# Patient Record
Sex: Female | Born: 1985 | Race: Black or African American | Hispanic: No | Marital: Married | Smoking: Never smoker
Health system: Southern US, Community
[De-identification: ages and names within clinical notes are randomized; demographics above are authoritative.]

## PROBLEM LIST (undated history)

## (undated) ENCOUNTER — Inpatient Hospital Stay (HOSPITAL_COMMUNITY): Payer: Self-pay

## (undated) DIAGNOSIS — N83209 Unspecified ovarian cyst, unspecified side: Secondary | ICD-10-CM

## (undated) DIAGNOSIS — F419 Anxiety disorder, unspecified: Secondary | ICD-10-CM

## (undated) DIAGNOSIS — F32A Depression, unspecified: Secondary | ICD-10-CM

## (undated) DIAGNOSIS — D649 Anemia, unspecified: Secondary | ICD-10-CM

## (undated) HISTORY — PX: OVARIAN CYST REMOVAL: SHX89

## (undated) HISTORY — PX: OOPHORECTOMY: SHX86

## (undated) HISTORY — DX: Anemia, unspecified: D64.9

## (undated) HISTORY — PX: LAPAROSCOPIC OVARIAN CYSTECTOMY: SUR786

## (undated) HISTORY — DX: Depression, unspecified: F32.A

## (undated) HISTORY — PX: BREAST SURGERY: SHX581

---

## 2010-12-15 HISTORY — PX: UNILATERAL SALPINGECTOMY: SHX6160

## 2012-01-26 LAB — US OB TRANSVAGINAL

## 2013-01-12 ENCOUNTER — Encounter (HOSPITAL_COMMUNITY): Payer: Self-pay | Admitting: *Deleted

## 2013-01-12 ENCOUNTER — Inpatient Hospital Stay (HOSPITAL_COMMUNITY)
Admission: AD | Admit: 2013-01-12 | Discharge: 2013-01-12 | Disposition: A | Discharge status: Home / Self Care | Payer: Self-pay | Source: Ambulatory Visit | Attending: Obstetrics & Gynecology | Admitting: Obstetrics & Gynecology

## 2013-01-12 DIAGNOSIS — B3731 Acute candidiasis of vulva and vagina: Secondary | ICD-10-CM | POA: Insufficient documentation

## 2013-01-12 DIAGNOSIS — N949 Unspecified condition associated with female genital organs and menstrual cycle: Secondary | ICD-10-CM | POA: Insufficient documentation

## 2013-01-12 DIAGNOSIS — B373 Candidiasis of vulva and vagina: Secondary | ICD-10-CM | POA: Insufficient documentation

## 2013-01-12 LAB — WET PREP, GENITAL
Clue Cells Wet Prep HPF POC: NONE SEEN
Trich, Wet Prep: NONE SEEN

## 2013-01-12 LAB — URINALYSIS, ROUTINE W REFLEX MICROSCOPIC
Glucose, UA: NEGATIVE mg/dL
Hgb urine dipstick: NEGATIVE
Protein, ur: NEGATIVE mg/dL

## 2013-01-12 LAB — URINE MICROSCOPIC-ADD ON

## 2013-01-12 MED ORDER — NYSTATIN-TRIAMCINOLONE 100000-0.1 UNIT/GM-% EX OINT: TOPICAL_OINTMENT | Freq: Two times a day (BID) | CUTANEOUS | Status: DC

## 2013-01-12 MED ORDER — TERCONAZOLE 0.4 % VA CREA: 1.0000 | TOPICAL_CREAM | Freq: Every day | VAGINAL | Status: DC

## 2013-01-12 NOTE — MAU Note (Signed)
Vaginal pain and swelling started yesterday afternoon, keeps getting worse.

## 2013-01-12 NOTE — Progress Notes (Signed)
Upon visual exam, labia and vagina are not swollen, no lesions noted.  Small amount white, clumpy discharge noted.

## 2013-01-12 NOTE — MAU Provider Note (Signed)
History     CSN: [id number]  Arrival date and time: 01/12/13 4782   None     Chief Complaint  Patient presents with  . Vaginal Pain   HPI 27 y.o. [id number] with vaginal pain and swelling since yesterday afternoon, getting worse, + white discharge, some itching.     History reviewed. No pertinent past medical history.  Past Surgical History  Procedure Date  . Ovarian cyst removal     Had to remove L ovary  . Oophorectomy     left  . Cesarean section     History reviewed. No pertinent family history.  History  Substance Use Topics  . Smoking status: Never Smoker   . Smokeless tobacco: Never Used  . Alcohol Use: No    Allergies: No Known Allergies  Prescriptions prior to admission  Medication Sig Dispense Refill  . ibuprofen (ADVIL,MOTRIN) 200 MG tablet Take 200 mg by mouth every 6 (six) hours as needed.        Review of Systems  Constitutional: Negative.   Respiratory: Negative.   Cardiovascular: Negative.   Gastrointestinal: Negative for nausea, vomiting, abdominal pain, diarrhea and constipation.  Genitourinary: Negative for dysuria, urgency, frequency, hematuria and flank pain.       Negative for vaginal bleeding, + vaginal discharge  Musculoskeletal: Negative.   Neurological: Negative.   Psychiatric/Behavioral: Negative.    Physical Exam   Blood pressure 123/59, pulse 74, temperature 98.6 F (37 C), temperature source Oral, resp. rate 18, height 5\' 7"  (1.702 m), weight 193 lb 3.2 oz (87.635 kg).  Physical Exam  Constitutional: She is oriented to person, place, and time. She appears well-developed and well-nourished. No distress.  HENT:  Head: Normocephalic and atraumatic.  Cardiovascular: Normal rate.   Respiratory: Effort normal. No respiratory distress.  Genitourinary: There is no rash or lesion on the right labia. There is no rash or lesion on the left labia. Cervix exhibits no motion tenderness, no discharge and no friability. There is  tenderness around the vagina. No erythema or bleeding around the vagina. Vaginal discharge (white, curdlike) found.  Neurological: She is alert and oriented to person, place, and time.  Skin: Skin is warm and dry.  Psychiatric: She has a normal mood and affect.    MAU Course  Procedures Results for orders placed during the hospital encounter of 01/12/13 (from the past 24 hour(s))  POCT PREGNANCY, URINE     Status: Normal   Collection Time   01/12/13  8:09 AM      Component Value Range   Preg Test, Ur NEGATIVE  NEGATIVE     Assessment and Plan   1. Yeast vaginitis       Medication List     As of 01/12/2013  8:48 AM    START taking these medications         nystatin-triamcinolone ointment   Commonly known as: MYCOLOG   Apply topically 2 (two) times daily.      terconazole 0.4 % vaginal cream   Commonly known as: TERAZOL 7   Place 1 applicator vaginally at bedtime.      CONTINUE taking these medications         ibuprofen 200 MG tablet   Commonly known as: ADVIL,MOTRIN          Where to get your medications    These are the prescriptions that you need to pick up.   You may get these medications from any pharmacy.  nystatin-triamcinolone ointment   terconazole 0.4 % vaginal cream            Follow-up Information    Follow up with [facility] [place]. (As needed)    Contact information:   [address] [place] [address] [phone]           Horatio Bertz 01/12/2013, 8:16 AM

## 2013-01-13 LAB — GC/CHLAMYDIA PROBE AMP: CT Probe RNA: NEGATIVE

## 2014-10-16 ENCOUNTER — Encounter (HOSPITAL_COMMUNITY): Payer: Self-pay | Admitting: *Deleted

## 2014-10-18 ENCOUNTER — Encounter: Payer: Self-pay | Admitting: Obstetrics & Gynecology

## 2014-10-18 ENCOUNTER — Ambulatory Visit (INDEPENDENT_AMBULATORY_CARE_PROVIDER_SITE_OTHER): Payer: 59 | Admitting: Obstetrics & Gynecology

## 2014-10-18 VITALS — BP 111/71 | HR 71 | Temp 98.8°F | Ht 66.0 in | Wt 195.0 lb

## 2014-10-18 DIAGNOSIS — Z01419 Encounter for gynecological examination (general) (routine) without abnormal findings: Secondary | ICD-10-CM

## 2014-10-18 DIAGNOSIS — Z3202 Encounter for pregnancy test, result negative: Secondary | ICD-10-CM

## 2014-10-18 LAB — CBC
HCT: 36.5 % (ref 36.0–46.0)
Hemoglobin: 11.8 g/dL — ABNORMAL LOW (ref 12.0–15.0)
MCH: 26.6 pg (ref 26.0–34.0)
MCHC: 32.3 g/dL (ref 30.0–36.0)
MCV: 82.4 fL (ref 78.0–100.0)
PLATELETS: 307 10*3/uL (ref 150–400)
RBC: 4.43 MIL/uL (ref 3.87–5.11)
RDW: 15.5 % (ref 11.5–15.5)
WBC: 8.9 10*3/uL (ref 4.0–10.5)

## 2014-10-18 LAB — POCT URINE PREGNANCY: PREG TEST UR: NEGATIVE

## 2014-10-19 LAB — HIV ANTIBODY (ROUTINE TESTING W REFLEX): HIV: NONREACTIVE

## 2014-10-19 LAB — RPR

## 2014-10-20 ENCOUNTER — Telehealth: Payer: Self-pay | Admitting: *Deleted

## 2014-10-20 LAB — PAP IG, CT-NG NAA, HPV HIGH-RISK
CHLAMYDIA PROBE AMP: NEGATIVE
GC PROBE AMP: NEGATIVE
HPV DNA HIGH RISK: NOT DETECTED

## 2014-10-20 NOTE — Telephone Encounter (Signed)
Pt placed call to office stating that Rx was to be sent to her pharmacy at her last visit, 10-18-14. Return call to pt, no answer.  Left message to contact office on Monday.

## 2014-10-20 NOTE — Patient Instructions (Signed)

## 2014-10-20 NOTE — Progress Notes (Signed)
Subjective:     Tammy Hanson is a 28 y.o. female here for a routine exam.     Personal health questionnaire:  Is patient Ashkenazi Jewish, have a family history of breast and/or ovarian cancer: no Is there a family history of uterine cancer diagnosed at age < 3650, gastrointestinal cancer, urinary tract cancer, family member who is a Personnel officerLynch syndrome-associated carrier: no Is the patient overweight and hypertensive, family history of diabetes, personal history of gestational diabetes or PCOS: no Is patient over 755, have PCOS,  family history of premature CHD under age 26, diabetes, smoke, have hypertension or peripheral artery disease:  no At any time, has a partner hit, kicked or otherwise hurt or frightened you?: no Over the past 2 weeks, have you felt down, depressed or hopeless?: no Over the past 2 weeks, have you felt little interest or pleasure in doing things?:no   Gynecologic History Patient's last menstrual period was 10/16/2014 (exact date).    Obstetric History OB History  Gravida Para Term Preterm AB SAB TAB Ectopic Multiple Living  4 1 1  3 3    2     # Outcome Date GA Lbr Len/2nd Weight Sex Delivery Anes PTL Lv  4 SAB 06/14/12     SAB     3 SAB 07/16/11     SAB     2 SAB 01/28/11     SAB     1 Term 12/16/09 [id number]  3.033 kg (6 lb 11 oz) M CS-LTranv EPI N Y     Complications: Umbilical cord around fetal neck with cord compression      Past Medical History  Diagnosis Date  . 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 Ovarian cyst removal      Had to remove L ovary  . Oophorectomy      left  . Cesarean section    . Unilateral salpingectomy Right 2012    No current outpatient prescriptions on file. No Known Allergies  History  Substance Use Topics  . Smoking status: Never Smoker   . Smokeless tobacco: Never Used  . Alcohol Use: No    Family History  Problem Relation Age of Onset  . Heart disease Mother        Review of Systems  Constitutional: negative for fatigue and weight loss Respiratory: negative for cough and wheezing Cardiovascular: negative for chest pain, fatigue and palpitations Gastrointestinal: negative for abdominal pain and change in bowel habits Musculoskeletal:negative for myalgias Neurological: negative for gait problems and tremors Behavioral/Psych: negative for abusive relationship, depression Endocrine: negative for temperature intolerance   Genitourinary:negative for abnormal menstrual periods, genital lesions, hot flashes, sexual problems and vaginal discharge Integument/breast: negative for breast lump, breast tenderness, nipple discharge and skin lesion(s)    Objective:       BP 111/71 mmHg  Pulse 71  Temp(Src) 98.8 F (37.1 C)  Ht 5\' 6"  (1.676 m)  Wt 88.451 kg (195 lb)  BMI 31.49 kg/m2  LMP 10/16/2014 (Exact Date) General:   alert  Skin:   no rash or abnormalities  Lungs:   clear to auscultation bilaterally  Heart:   regular rate and rhythm, S1, S2 normal, no murmur, click, rub or gallop  Breasts:   normal without suspicious masses, skin or nipple changes or axillary nodes  Abdomen:  normal findings: no organomegaly, soft, non-tender and no hernia  Pelvis:  External genitalia: normal general appearance Urinary system: urethral  meatus normal and bladder without fullness, nontender Vaginal: normal without tenderness, induration or masses Cervix: normal appearance Adnexa: normal bimanual exam Uterus: anteverted and non-tender, normal size   Lab Review Urine pregnancy test Labs reviewed no Radiologic studies reviewed no     Assessment:    Healthy female exam.   ?h/o an early stage ovarian cancer   Plan:    Education reviewed: calcium supplements, low fat, low cholesterol diet, safe sex/STD prevention and weight bearing exercise.    Orders Placed This Encounter  Procedures  . HIV antibody  . RPR  . CBC  . POCT urine pregnancy   Need  to obtain previous records Follow up as needed.

## 2014-10-23 NOTE — Telephone Encounter (Signed)
Patient called regarding her RX- 10:34 11/9 4:04 Returned call- LM on VM to CB- no Rx mentioned at visit.

## 2014-10-23 NOTE — Telephone Encounter (Signed)
4:36  Received message from pt stating that she missed a call from office.  Pt ask that a message be left on voicemail regarding Rx. 5:13 return call to pt.  No answer, left message making pt aware that there is no note of medication to be sent to pharmacy.  Pt asked to contact office to let us know what medication was discussed and to be sent.

## 2014-10-24 ENCOUNTER — Other Ambulatory Visit: Payer: Self-pay | Admitting: *Deleted

## 2014-10-24 DIAGNOSIS — Z30013 Encounter for initial prescription of injectable contraceptive: Secondary | ICD-10-CM

## 2014-10-24 MED ORDER — MEDROXYPROGESTERONE ACETATE 150 MG/ML IM SUSP
150.0000 mg | INTRAMUSCULAR | Status: DC
Start: 2014-10-24 — End: 2015-03-07

## 2014-10-24 NOTE — Telephone Encounter (Signed)
Patient called to let us know that her Depo provera was not at the pharmacy.  2:01 Call to patient - patient states she has an appointment tomorrow for a Depo injection and that the doctor has not sent her Rx. Told patient we were sending it now and she could pick it up.

## 2014-11-01 ENCOUNTER — Other Ambulatory Visit (INDEPENDENT_AMBULATORY_CARE_PROVIDER_SITE_OTHER): Payer: 59 | Admitting: *Deleted

## 2014-11-01 VITALS — BP 107/64 | HR 76

## 2014-11-01 DIAGNOSIS — Z01818 Encounter for other preprocedural examination: Secondary | ICD-10-CM

## 2014-11-01 DIAGNOSIS — Z3042 Encounter for surveillance of injectable contraceptive: Secondary | ICD-10-CM

## 2014-11-01 DIAGNOSIS — Z3202 Encounter for pregnancy test, result negative: Secondary | ICD-10-CM

## 2014-11-01 DIAGNOSIS — Z30013 Encounter for initial prescription of injectable contraceptive: Secondary | ICD-10-CM

## 2014-11-01 LAB — POCT URINE PREGNANCY: Preg Test, Ur: NEGATIVE

## 2014-11-01 MED ORDER — MEDROXYPROGESTERONE ACETATE 150 MG/ML IM SUSP
150.0000 mg | Freq: Once | INTRAMUSCULAR | Status: AC
  Administered 2014-11-01: 150 mg via INTRAMUSCULAR

## 2014-11-01 NOTE — Progress Notes (Signed)
Pt is in office today for start of depo.  Pt was seen in office on 10-18-14 and had negative pregnancy test.  UPT in office today is negative.  Pt made aware of Depo policy and advised if she has any concerns to contact the office.  Depo was given and pt tolerated well.  Pt advised to RTO on 01-20-15 for next injection.   BP 107/64 mmHg  Pulse 76  LMP 10/16/2014 (Exact Date)   Administrations This Visit    medroxyPROGESTERone (DEPO-PROVERA) injection 150 mg    Administered Action Dose Route Administered By         11/01/2014 Given 150 mg Intramuscular Lanney GinsSuzanne D Judyann Casasola, CMA

## 2014-12-05 ENCOUNTER — Telehealth: Payer: Self-pay | Admitting: *Deleted

## 2014-12-05 NOTE — Telephone Encounter (Signed)
Patient is calling about her birth control. 4:59 LM on VM to CB

## 2014-12-06 NOTE — Telephone Encounter (Signed)
9:08 Patient states she has been bleeding for 10 days. Patient is using the Depo- her first injection was at last visit. Patient reports regular flow. Patient would like to stop bleeding.

## 2014-12-09 NOTE — Telephone Encounter (Signed)
No treatment is necessary.  Can offer 1 month of COCP

## 2014-12-11 ENCOUNTER — Encounter: Payer: Self-pay | Admitting: *Deleted

## 2014-12-12 ENCOUNTER — Encounter: Payer: Self-pay | Admitting: Obstetrics & Gynecology

## 2014-12-19 NOTE — Telephone Encounter (Signed)
LM on VM for patient to CB. 

## 2015-01-16 NOTE — Telephone Encounter (Signed)
No response to call- refile

## 2015-01-23 ENCOUNTER — Ambulatory Visit (INDEPENDENT_AMBULATORY_CARE_PROVIDER_SITE_OTHER): Payer: 59 | Admitting: *Deleted

## 2015-01-23 VITALS — BP 117/78 | HR 56 | Temp 97.8°F | Wt 193.0 lb

## 2015-01-23 DIAGNOSIS — Z3042 Encounter for surveillance of injectable contraceptive: Secondary | ICD-10-CM

## 2015-01-23 MED ORDER — MEDROXYPROGESTERONE ACETATE 150 MG/ML IM SUSP
150.0000 mg | Freq: Once | INTRAMUSCULAR | Status: AC
  Administered 2015-01-23: 150 mg via INTRAMUSCULAR

## 2015-01-23 NOTE — Progress Notes (Signed)
Pt is in office today for depo injection.  Pt is on time for her injection.  Pt tolerated well. Pt advised to RTO on Apr 17, 2015 for next injection.    BP 117/78 mmHg  Pulse 56  Temp(Src) 97.8 F (36.6 C)  Wt 193 lb (87.544 kg)  LMP 01/23/2015

## 2015-01-30 ENCOUNTER — Telehealth: Payer: Self-pay | Admitting: *Deleted

## 2015-01-30 NOTE — Telephone Encounter (Signed)
11:12 Patient  returned call - she is at work and her lunch is 1:30-2:00. Please call then.

## 2015-01-30 NOTE — Telephone Encounter (Signed)
Bleeding on the Depo shot for 2 weeks. 10:45 LM on VM to CB.

## 2015-03-02 ENCOUNTER — Telehealth: Payer: Self-pay | Admitting: *Deleted

## 2015-03-02 DIAGNOSIS — N939 Abnormal uterine and vaginal bleeding, unspecified: Secondary | ICD-10-CM

## 2015-03-02 MED ORDER — LEVONORGESTREL-ETHINYL ESTRAD 0.15-30 MG-MCG PO TABS: ORAL_TABLET | ORAL | Status: DC

## 2015-03-02 NOTE — Telephone Encounter (Signed)
See next note

## 2015-03-02 NOTE — Telephone Encounter (Signed)
Patient called with problem with birth control. 9:40 Call to patient- she is using Depo and she is having bleeding for 2 weeks- off 1 day- and then bleeding 2 weeks again. She wants to try to continue the Depo,but needs to stop her BTB. Will try birth control pills to stop per Dr Clearance CootsHarper. Patient instructed and Rx called to pharmacy.

## 2015-03-07 ENCOUNTER — Encounter (HOSPITAL_COMMUNITY): Payer: Self-pay | Admitting: *Deleted

## 2015-03-07 ENCOUNTER — Inpatient Hospital Stay (HOSPITAL_COMMUNITY)
Admission: AD | Admit: 2015-03-07 | Discharge: 2015-03-07 | Disposition: A | Discharge status: Home / Self Care | Payer: 59 | Source: Ambulatory Visit | Attending: Obstetrics | Admitting: Obstetrics

## 2015-03-07 ENCOUNTER — Inpatient Hospital Stay (HOSPITAL_COMMUNITY): Payer: 59

## 2015-03-07 DIAGNOSIS — F410 Panic disorder [episodic paroxysmal anxiety] without agoraphobia: Secondary | ICD-10-CM | POA: Diagnosis not present

## 2015-03-07 DIAGNOSIS — R0602 Shortness of breath: Secondary | ICD-10-CM | POA: Insufficient documentation

## 2015-03-07 DIAGNOSIS — R0789 Other chest pain: Secondary | ICD-10-CM | POA: Diagnosis not present

## 2015-03-07 DIAGNOSIS — F419 Anxiety disorder, unspecified: Secondary | ICD-10-CM | POA: Insufficient documentation

## 2015-03-07 DIAGNOSIS — R079 Chest pain, unspecified: Secondary | ICD-10-CM | POA: Diagnosis present

## 2015-03-07 LAB — URINALYSIS, ROUTINE W REFLEX MICROSCOPIC
Bilirubin Urine: NEGATIVE
Glucose, UA: NEGATIVE mg/dL
Ketones, ur: NEGATIVE mg/dL
Leukocytes, UA: NEGATIVE
Nitrite: NEGATIVE
Protein, ur: NEGATIVE mg/dL
Specific Gravity, Urine: 1.015 (ref 1.005–1.030)
Urobilinogen, UA: 0.2 mg/dL (ref 0.0–1.0)
pH: 6 (ref 5.0–8.0)

## 2015-03-07 LAB — CBC
HEMATOCRIT: 35.4 % — AB (ref 36.0–46.0)
Hemoglobin: 11.4 g/dL — ABNORMAL LOW (ref 12.0–15.0)
MCH: 27.2 pg (ref 26.0–34.0)
MCHC: 32.2 g/dL (ref 30.0–36.0)
MCV: 84.5 fL (ref 78.0–100.0)
Platelets: 233 10*3/uL (ref 150–400)
RBC: 4.19 MIL/uL (ref 3.87–5.11)
RDW: 15 % (ref 11.5–15.5)
WBC: 10.2 10*3/uL (ref 4.0–10.5)

## 2015-03-07 LAB — URINE MICROSCOPIC-ADD ON

## 2015-03-07 LAB — D-DIMER, QUANTITATIVE (NOT AT ARMC): D-Dimer, Quant: 0.74 ug/mL-FEU — ABNORMAL HIGH (ref 0.00–0.48)

## 2015-03-07 LAB — POCT PREGNANCY, URINE: Preg Test, Ur: NEGATIVE

## 2015-03-07 MED ORDER — IOHEXOL 350 MG/ML SOLN
100.0000 mL | Freq: Once | INTRAVENOUS | Status: AC | PRN
  Administered 2015-03-07: 100 mL via INTRAVENOUS

## 2015-03-07 MED ORDER — ONDANSETRON 4 MG PO TBDP
4.0000 mg | ORAL_TABLET | Freq: Once | ORAL | Status: AC
  Administered 2015-03-07: 4 mg via ORAL
  Filled 2015-03-07: qty 1

## 2015-03-07 MED ORDER — ALPRAZOLAM 0.25 MG PO TABS
0.2500 mg | ORAL_TABLET | Freq: Once | ORAL | Status: AC
  Administered 2015-03-07: 0.25 mg via ORAL
  Filled 2015-03-07: qty 1

## 2015-03-07 MED ORDER — ALPRAZOLAM 0.25 MG PO TABS: 0.2500 mg | ORAL_TABLET | Freq: Every evening | ORAL | Status: DC | PRN

## 2015-03-07 MED ORDER — GI COCKTAIL ~~LOC~~
30.0000 mL | Freq: Once | ORAL | Status: AC
  Administered 2015-03-07: 30 mL via ORAL
  Filled 2015-03-07: qty 30

## 2015-03-07 NOTE — Discharge Instructions (Signed)

## 2015-03-07 NOTE — MAU Note (Signed)
Started on Sat. Extreme chest pain.  Felt shaky, short of breath, pain in heart, heart started racing. Was laying down when occurred. Took a while for it to calm down.  Happened again this morning. Threw up 3 times when occurred this morning. Now, "feels awful",chest hurts, hard to breath.  (o2 sat is 100, closed mouth breathing, unlabored, able to talk without pause or exertion).

## 2015-03-07 NOTE — MAU Provider Note (Signed)
History     CSN: [id number]  Arrival date and time: 03/07/15 7829   First Provider Initiated Contact with Patient 03/07/15 [phone]      Chief Complaint  Patient presents with  . Shortness of Breath  . Chest Pain   HPI Comments: Tammy Hanson is 29 y.o. Female [id number] who presents with chest pain. The chest pain started Saturday evening; she woke up with the pain. She started birth control pills on Saturday morning. She took [facility] pills on Saturday, Sunday and Monday and stopped. She does not smoke. Denies history of blood clot.  She ate a buffalo chicken wrap last night for dinner with a smoothie; she vomited this morning.  She does have a history anxiety; and has had an anxiety attack in the past. She does feel like this is the same feeling.   Chest Pain  This is a new problem. The current episode started in the past 7 days. The onset quality is gradual. The problem occurs constantly. The problem has been waxing and waning. The pain is present in the substernal region (Patient able to produce the pain by pushing on chest.). The pain is at a severity of 7/10. The pain is moderate. The quality of the pain is described as tightness and pressure. Associated symptoms include abdominal pain, dizziness, nausea, shortness of breath and vomiting. Pertinent negatives include no claudication, orthopnea or palpitations. The pain is aggravated by nothing. She has tried nothing for the symptoms. Risk factors include hormone replacement therapy and stress.    OB History    Gravida Para Term Preterm AB TAB SAB Ectopic Multiple Living   Past Medical History  Diagnosis Date  . Cancer     Past Surgical History  Procedure Laterality Date  . Ovarian cyst removal      Had to remove L ovary  . Oophorectomy      left  . Cesarean section    . Unilateral salpingectomy Right 2012    Family History  Problem Relation Age of Onset  . Heart disease Mother     History   Substance Use Topics  . Smoking status: Never Smoker   . Smokeless tobacco: Never Used  . Alcohol Use: 0.0 oz/week    0 Standard drinks or equivalent per week     Comment: occ    Allergies: No Known Allergies  Prescriptions prior to admission  Medication Sig Dispense Refill Last Dose  . levonorgestrel-ethinyl estradiol (NORDETTE) 0.15-30 MG-MCG tablet 2 tablets daily- active pills only for abnormal bleeding on Depo 1 Package 0   . medroxyPROGESTERone (DEPO-PROVERA) 150 MG/ML injection Inject 1 mL (150 mg total) into the muscle every 3 (three) months. 150 mL 3    Results for orders placed or performed during the hospital encounter of 03/07/15 (from the past 48 hour(s))  Urinalysis, Routine w reflex microscopic     Status: Abnormal   Collection Time: 03/07/15  9:35 AM  Result Value Ref Range   Color, Urine YELLOW YELLOW   APPearance CLEAR CLEAR   Specific Gravity, Urine 1.015 1.005 - 1.030   pH 6.0 5.0 - 8.0   Glucose, UA NEGATIVE NEGATIVE mg/dL   Hgb urine dipstick SMALL (A) NEGATIVE   Bilirubin Urine NEGATIVE NEGATIVE   Ketones, ur NEGATIVE NEGATIVE mg/dL   Protein, ur NEGATIVE NEGATIVE mg/dL   Urobilinogen, UA 0.2 0.0 - 1.0 mg/dL   Nitrite NEGATIVE NEGATIVE  Leukocytes, UA NEGATIVE NEGATIVE  Urine microscopic-add on     Status: None   Collection Time: 03/07/15  9:35 AM  Result Value Ref Range   Squamous Epithelial / LPF RARE RARE   RBC / HPF 0-2 <3 RBC/hpf  Pregnancy, urine POC     Status: None   Collection Time: 03/07/15  9:41 AM  Result Value Ref Range   Preg Test, Ur NEGATIVE NEGATIVE    Comment:        THE SENSITIVITY OF THIS METHODOLOGY IS >24 mIU/mL   CBC     Status: Abnormal   Collection Time: 03/07/15 11:30 AM  Result Value Ref Range   WBC 10.2 4.0 - 10.5 K/uL   RBC 4.19 3.87 - 5.11 MIL/uL   Hemoglobin 11.4 (L) 12.0 - 15.0 g/dL   HCT 40.935.4 (L) 81.136.0 - 91.446.0 %   MCV 84.5 78.0 - 100.0 fL   MCH 27.2 26.0 - 34.0 pg   MCHC 32.2 30.0 - 36.0 g/dL   RDW 78.215.0  95.611.5 - 21.315.5 %   Platelets 233 150 - 400 K/uL  D-dimer, quantitative     Status: Abnormal   Collection Time: 03/07/15 11:30 AM  Result Value Ref Range   D-Dimer, Quant 0.74 (H) 0.00 - 0.48 ug/mL-FEU    Comment:        AT THE INHOUSE ESTABLISHED CUTOFF VALUE OF 0.48 ug/mL FEU, THIS ASSAY HAS BEEN DOCUMENTED IN THE LITERATURE TO HAVE A SENSITIVITY AND NEGATIVE PREDICTIVE VALUE OF AT LEAST 98 TO 99%.  THE TEST RESULT SHOULD BE CORRELATED WITH AN ASSESSMENT OF THE CLINICAL PROBABILITY OF DVT / VTE.    Ct Angio Chest Pe W/cm &/or Wo Cm  03/07/2015   CLINICAL DATA:  Five day history of chest pressure and shortness of breath.  EXAM: CT ANGIOGRAPHY CHEST WITH CONTRAST  TECHNIQUE: Multidetector CT imaging of the chest was performed using the standard protocol during bolus administration of intravenous contrast. Multiplanar CT image reconstructions and MIPs were obtained to evaluate the vascular anatomy.  CONTRAST:  100mL OMNIPAQUE IOHEXOL 350 MG/ML SOLN  COMPARISON:  None.  FINDINGS: Mediastinum/Nodes: No filling defect is identified in the pulmonary arterial tree to suggest pulmonary embolus. No acute aortic abnormality observed. Small bilateral axillary lymph nodes are not pathologically enlarged by size criteria. No other thoracic adenopathy observed.  Lungs/Pleura: Unremarkable  Upper abdomen: Possible hepatic steatosis.  Musculoskeletal: Unremarkable  Review of the MIP images confirms the above findings.  IMPRESSION: 1. Possible hepatic steatosis. Otherwise, no significant abnormalities are observed. No filling defect is identified in the pulmonary arterial tree to suggest pulmonary embolus.   Electronically Signed   By: Gaylyn RongWalter  Liebkemann M.D.   On: 03/07/2015 13:30     Review of Systems  Eyes: Negative for blurred vision.  Respiratory: Positive for shortness of breath.   Cardiovascular: Positive for chest pain. Negative for palpitations, orthopnea, claudication and leg swelling.   Gastrointestinal: Positive for nausea, vomiting and abdominal pain.  Neurological: Positive for dizziness.   Physical Exam   Blood pressure 122/67, pulse 84, temperature 98.6 F (37 C), temperature source Oral, resp. rate 16, height 5' 5.5" (1.664 m), weight 87.091 kg (192 lb), last menstrual period 02/20/2015, SpO2 100 %.  Physical Exam  Constitutional: She is oriented to person, place, and time. Vital signs are normal. She appears well-developed and well-nourished.  Non-toxic appearance. She does not have a sickly appearance. She does not appear ill. No distress.  HENT:  Head: Normocephalic.  Eyes: Pupils  are equal, round, and reactive to light.  Neck: Neck supple.  Cardiovascular: Normal rate.   Murmur heard. Respiratory: Effort normal and breath sounds normal. No respiratory distress. She has no wheezes. She has no rales.  Musculoskeletal: Normal range of motion.  Neurological: She is alert and oriented to person, place, and time.  Skin: Skin is warm. She is not diaphoretic.  Psychiatric: Her behavior is normal.    MAU Course  Procedures  None  MDM  EKG Gi Cock tail Zofran 4 mg ODT   Normal sinus rhythm per EKG.   Gi cocktail allevatied the pressure in her chest. Currently rates pain 5/10; D-Dimer ordered, CBC  Xanax 0.25 mg PO in MAU.   Patient denies pain following xanax and gi cocktail> due to elevated D-Dimer and patients recent birthcontrol pill use I will do a CT   Discussed patient with Dr. Macon Large   Assessment and Plan   A:  1. Anxiety attack   2. Shortness of breath   3. Other chest pain     P:  Discharge home in stable condition Go to [facility] or [facility] if shortness of breath persists RX: Xanax Follow up with OB as scheduled  Duane Lope, NP 03/07/2015 10:12 AM

## 2015-03-07 NOTE — MAU Note (Signed)
Pts blood pressure was taken throughout stay and evaluation every 15 minutes.  The blood pressures were assessed each time and WNL. The Equipment did not save or record data for the pts stay.

## 2015-03-07 NOTE — MAU Note (Signed)
Pt feeling better. No chest tightness, no pain.  Symptoms relieved. Will continue to monitor.

## 2015-03-27 ENCOUNTER — Telehealth: Payer: Self-pay | Admitting: *Deleted

## 2015-03-27 NOTE — Telephone Encounter (Signed)
Patient wants to take OCP pills to stop her bleeding on Depo- patient has decided that she does not want to continue her Depo. 3:05 LM on VM to CB- need to know what form of birth control patient wants to use in the place of her Depo provera.

## 2015-03-28 ENCOUNTER — Other Ambulatory Visit: Payer: Self-pay | Admitting: *Deleted

## 2015-03-28 DIAGNOSIS — Z3041 Encounter for surveillance of contraceptive pills: Secondary | ICD-10-CM

## 2015-03-28 MED ORDER — LEVONORGESTREL-ETHINYL ESTRAD 0.15-30 MG-MCG PO TABS
1.0000 | ORAL_TABLET | Freq: Every day | ORAL | Status: DC
Start: 2015-03-28 — End: 2015-08-15

## 2015-04-17 ENCOUNTER — Ambulatory Visit: Payer: 59

## 2015-07-04 ENCOUNTER — Telehealth: Payer: Self-pay | Admitting: *Deleted

## 2015-07-04 NOTE — Telephone Encounter (Signed)
Patient states she is not having any luck with her current birth control. Patient states she is currently on Nordette and was on the Depo before that. Patient scheduled for a birth control consult on 07-06-15.

## 2015-07-06 ENCOUNTER — Ambulatory Visit (INDEPENDENT_AMBULATORY_CARE_PROVIDER_SITE_OTHER): Payer: 59 | Admitting: Certified Nurse Midwife

## 2015-07-06 ENCOUNTER — Encounter: Payer: Self-pay | Admitting: Certified Nurse Midwife

## 2015-07-06 ENCOUNTER — Other Ambulatory Visit: Payer: Self-pay | Admitting: Certified Nurse Midwife

## 2015-07-06 VITALS — BP 104/74 | HR 94 | Temp 98.8°F | Wt 190.0 lb

## 2015-07-06 DIAGNOSIS — N939 Abnormal uterine and vaginal bleeding, unspecified: Secondary | ICD-10-CM | POA: Diagnosis not present

## 2015-07-06 DIAGNOSIS — Z113 Encounter for screening for infections with a predominantly sexual mode of transmission: Secondary | ICD-10-CM

## 2015-07-06 DIAGNOSIS — Z3009 Encounter for other general counseling and advice on contraception: Secondary | ICD-10-CM

## 2015-07-06 LAB — COMPREHENSIVE METABOLIC PANEL
AST: 15 U/L (ref 0–37)
Albumin: 4.2 g/dL (ref 3.5–5.2)
Alkaline Phosphatase: 62 U/L (ref 39–117)
BILIRUBIN TOTAL: 0.8 mg/dL (ref 0.2–1.2)
BUN: 6 mg/dL (ref 6–23)
CALCIUM: 9.5 mg/dL (ref 8.4–10.5)
CO2: 24 meq/L (ref 19–32)
CREATININE: 1 mg/dL (ref 0.50–1.10)
Chloride: 102 mEq/L (ref 96–112)
Glucose, Bld: 93 mg/dL (ref 70–99)
Potassium: 4.2 mEq/L (ref 3.5–5.3)
SODIUM: 139 meq/L (ref 135–145)
Total Protein: 7.7 g/dL (ref 6.0–8.3)

## 2015-07-06 LAB — CBC WITH DIFFERENTIAL/PLATELET
BASOS PCT: 0 % (ref 0–1)
Basophils Absolute: 0 10*3/uL (ref 0.0–0.1)
Eosinophils Absolute: 0 10*3/uL (ref 0.0–0.7)
Eosinophils Relative: 0 % (ref 0–5)
HEMATOCRIT: 36.5 % (ref 36.0–46.0)
HEMOGLOBIN: 11.6 g/dL — AB (ref 12.0–15.0)
LYMPHS ABS: 2.2 10*3/uL (ref 0.7–4.0)
Lymphocytes Relative: 22 % (ref 12–46)
MCH: 25.7 pg — ABNORMAL LOW (ref 26.0–34.0)
MCHC: 31.8 g/dL (ref 30.0–36.0)
MCV: 80.9 fL (ref 78.0–100.0)
MPV: 10.6 fL (ref 8.6–12.4)
Monocytes Absolute: 0.5 10*3/uL (ref 0.1–1.0)
Monocytes Relative: 5 % (ref 3–12)
NEUTROS ABS: 7.2 10*3/uL (ref 1.7–7.7)
NEUTROS PCT: 73 % (ref 43–77)
PLATELETS: 446 10*3/uL — AB (ref 150–400)
RBC: 4.51 MIL/uL (ref 3.87–5.11)
RDW: 15.6 % — ABNORMAL HIGH (ref 11.5–15.5)
WBC: 9.9 10*3/uL (ref 4.0–10.5)

## 2015-07-06 LAB — RPR

## 2015-07-06 LAB — HDL CHOLESTEROL: HDL: 48 mg/dL (ref >=46)

## 2015-07-06 LAB — CHOLESTEROL, TOTAL: CHOLESTEROL: 153 mg/dL (ref 0–200)

## 2015-07-06 LAB — TRIGLYCERIDES: TRIGLYCERIDES: 88 mg/dL (ref <150)

## 2015-07-06 MED ORDER — LEVONORGEST-ETH EST & ETH EST 42-21-21-7 DAYS PO TABS: 1.0000 | ORAL_TABLET | Freq: Every day | ORAL | Status: DC

## 2015-07-06 MED ORDER — IBUPROFEN 800 MG PO TABS: 800.0000 mg | ORAL_TABLET | Freq: Three times a day (TID) | ORAL | Status: DC | PRN

## 2015-07-06 NOTE — Progress Notes (Signed)
Patient ID: Tammy Hanson, female   DOB: 1985/12/27, 29 y.o.   MRN: [id number]   Chief Complaint  Patient presents with  . Advice Only    birth control    HPI Tammy Hanson is a 29 y.o. female.   Currently on Nordette, was previously on Depo-Injections.  Desires to stop having long periods.  States periods are 7+ days with heavy bleeding even on Depo.    HPI  Past Medical History  Diagnosis Date  . Cancer   ? Dx, does have hx of left ovarian cysts with removal; does have right ovary with ? Dx of complex ovarian cyst  Past Surgical History  Procedure Laterality Date  . Ovarian cyst removal      Had to remove L ovary  . Oophorectomy      left  . Cesarean section    . Unilateral salpingectomy Right 2012    Family History  Problem Relation Age of Onset  . Heart disease Mother     Social History History  Substance Use Topics  . Smoking status: Never Smoker   . Smokeless tobacco: Never Used  . Alcohol Use: 0.0 oz/week    0 Standard drinks or equivalent per week     Comment: occ    No Known Allergies  Current Outpatient Prescriptions  Medication Sig Dispense Refill  . levonorgestrel-ethinyl estradiol (NORDETTE, 28,) 0.15-30 MG-MCG tablet Take 1 tablet by mouth daily. 1 Package 11  . ALPRAZolam (XANAX) 0.25 MG tablet Take 1 tablet (0.25 mg total) by mouth at bedtime as needed for anxiety. (Patient not taking: Reported on 07/06/2015) 30 tablet 0  . ibuprofen (ADVIL,MOTRIN) 800 MG tablet Take 1 tablet (800 mg total) by mouth every 8 (eight) hours as needed. 60 tablet 1  . Levonorgest-Eth Est & Eth Est (QUARTETTE) 42-21-21-7 DAYS TABS Take 1 tablet by mouth daily. 91 tablet 1   No current facility-administered medications for this visit.    Review of Systems Review of Systems Constitutional: negative for fatigue and weight loss Respiratory: negative for cough and wheezing Cardiovascular: negative for chest pain, fatigue and palpitations Gastrointestinal:  negative for abdominal pain and change in bowel habits Genitourinary: +AUB Integument/breast: negative for nipple discharge Musculoskeletal:negative for myalgias Neurological: negative for gait problems and tremors Behavioral/Psych: negative for abusive relationship, depression Endocrine: negative for temperature intolerance     Blood pressure 104/74, pulse 94, temperature 98.8 F (37.1 C), weight 190 lb (86.183 kg), last menstrual period 06/27/2015.  Physical Exam Physical Exam General:   alert  Skin:   no rash or abnormalities  Lungs:   clear to auscultation bilaterally  Heart:   regular rate and rhythm, S1, S2 normal, no murmur, click, rub or gallop  Breasts:   deferred  Abdomen:  normal findings: no organomegaly, soft, non-tender and no hernia  Pelvis:  External genitalia: normal general appearance Urinary system: urethral meatus normal and bladder without fullness, nontender Vaginal: normal without tenderness, induration or masses Cervix: normal appearance Adnexa: no CMT Uterus: anteverted and non-tender, normal size    90% of 15 min visit spent on counseling and coordination of care.   Data Reviewed Previous medical hx, labs, meds  Assessment     AUB Dysmenorrhea Menorrahgia     Plan    Orders Placed This Encounter  Procedures  . US Transvaginal Non-OB    Standing Status: Future     Number of Occurrences:      Standing Expiration Date: 09/05/2016    Order Specific  Question:  Reason for Exam (SYMPTOM  OR DIAGNOSIS REQUIRED)    Answer:  AUB    Order Specific Question:  Preferred imaging location?    Answer:  [facility]  . US Pelvis Complete    Standing Status: Future     Number of Occurrences:      Standing Expiration Date: 09/05/2016    Order Specific Question:  Reason for Exam (SYMPTOM  OR DIAGNOSIS REQUIRED)    Answer:  AUB    Order Specific Question:  Preferred imaging location?    Answer:  [facility]  . HIV antibody (with reflex)  .  Hepatitis B surface antigen  . RPR  . Hepatitis C antibody  . TSH  . Prolactin  . Testosterone, Free, Total, SHBG  . 17-Hydroxyprogesterone  . Progesterone  . CBC with Differential/Platelet  . Comprehensive metabolic panel  . Cholesterol, total  . Triglycerides  . HDL cholesterol   Meds ordered this encounter  Medications  . Levonorgest-Eth Est & Eth Est (QUARTETTE) 42-21-21-7 DAYS TABS    Sig: Take 1 tablet by mouth daily.    Dispense:  91 tablet    Refill:  1  . ibuprofen (ADVIL,MOTRIN) 800 MG tablet    Sig: Take 1 tablet (800 mg total) by mouth every 8 (eight) hours as needed.    Dispense:  60 tablet    Refill:  1    Follow up in 3 months

## 2015-07-07 LAB — TSH: TSH: 0.174 u[IU]/mL — AB (ref 0.350–4.500)

## 2015-07-07 LAB — PROGESTERONE: Progesterone: <0.2 ng/mL

## 2015-07-07 LAB — PROLACTIN: PROLACTIN: 7 ng/mL

## 2015-07-07 LAB — HIV ANTIBODY (ROUTINE TESTING W REFLEX): HIV 1&2 Ab, 4th Generation: NONREACTIVE

## 2015-07-07 LAB — HEPATITIS B SURFACE ANTIGEN: Hepatitis B Surface Ag: NEGATIVE

## 2015-07-07 LAB — HEPATITIS C ANTIBODY: HCV Ab: NEGATIVE

## 2015-07-09 LAB — TESTOSTERONE, FREE, TOTAL, SHBG
SEX HORMONE BINDING: 60 nmol/L (ref 17–124)
TESTOSTERONE FREE: 1.6 pg/mL (ref 0.6–6.8)
TESTOSTERONE-% FREE: 1.2 % (ref 0.4–2.4)
Testosterone: 13 ng/dL (ref 10–70)

## 2015-07-10 ENCOUNTER — Other Ambulatory Visit: Payer: Self-pay | Admitting: Certified Nurse Midwife

## 2015-07-10 DIAGNOSIS — R7989 Other specified abnormal findings of blood chemistry: Secondary | ICD-10-CM

## 2015-07-10 LAB — THYROID PROFILE - CHCC
FREE THYROXINE INDEX: 2.7 (ref 1.4–3.8)
T3 Uptake: 25 % (ref 22–35)
T4, Total: 10.9 ug/dL (ref 4.5–12.0)

## 2015-07-10 LAB — 17-HYDROXYPROGESTERONE: 17-OH-Progesterone, LC/MS/MS: <8 ng/dL

## 2015-07-11 ENCOUNTER — Other Ambulatory Visit: Payer: Self-pay | Admitting: Certified Nurse Midwife

## 2015-07-13 ENCOUNTER — Other Ambulatory Visit: Payer: Self-pay | Admitting: Certified Nurse Midwife

## 2015-07-13 MED ORDER — LEVONORGEST-ETH ESTRAD 91-DAY 0.15-0.03 MG PO TABS: 1.0000 | ORAL_TABLET | Freq: Every day | ORAL | Status: DC

## 2015-07-18 ENCOUNTER — Ambulatory Visit (HOSPITAL_COMMUNITY): Payer: 59

## 2015-07-25 ENCOUNTER — Telehealth: Payer: Self-pay | Admitting: *Deleted

## 2015-07-25 NOTE — Telephone Encounter (Signed)
Patient states she was recently in the office and her birth control pill was changed to help stop her bleeding. Patient states she has continued to bleed.  Attempted to contact the patient and left message for patient to call the office.

## 2015-07-26 NOTE — Telephone Encounter (Signed)
Patient returned call to the office. Patient states she is on her second type of birth control pill this year to help control her bleeding. Patient states her bleeding is not getting any better and she is also passing clots. Patient states she is getting frustrated. Patient has an ultrasound scheduled but states it is not scheduled until 08-22-15 due to her work schedule. Advised patient to wee if she would be able to have the ultrasound performed sooner so we may be able to start providing answer and solutions for her sooner. Patient to check with her boss to adjust her work schedule to accommodate an earlier ultrasound appointment. Patient would like to know if there is anything else sh can do in the mean time.

## 2015-07-31 ENCOUNTER — Other Ambulatory Visit: Payer: Self-pay | Admitting: Certified Nurse Midwife

## 2015-07-31 IMAGING — CT CT ANGIO CHEST
1 of 2 series · 19 of 32 positions shown · IV contrast (omnipaque)
Comparison: None.

CLINICAL DATA: Five day history of chest pressure and shortness of
breath.

EXAM:
CT ANGIOGRAPHY CHEST WITH CONTRAST
TECHNIQUE: Multidetector CT imaging of the chest was performed using the
standard protocol during bolus administration of intravenous
contrast. Multiplanar CT image reconstructions and MIPs were
obtained to evaluate the vascular anatomy.
CONTRAST:  100mL OMNIPAQUE IOHEXOL 350 MG/ML SOLN

[Series 5: (person_name) thins · axial · 0.69mm/px · z∈[-14,+201]mm · 19 of 343 slices shown]
[im 18/343  lung]
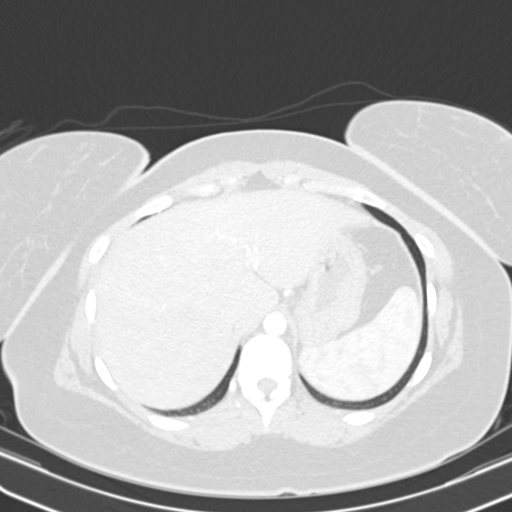
[im 35/343  mediastinal]
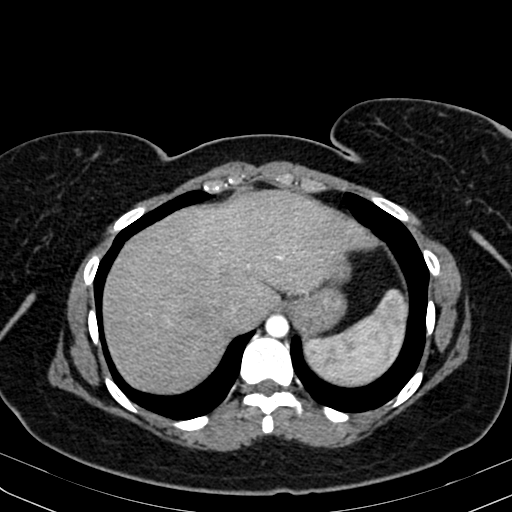
[im 52/343  lung]
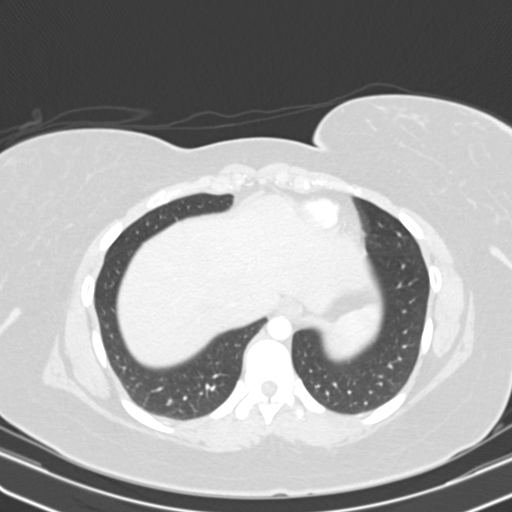
[im 86/343  mediastinal]
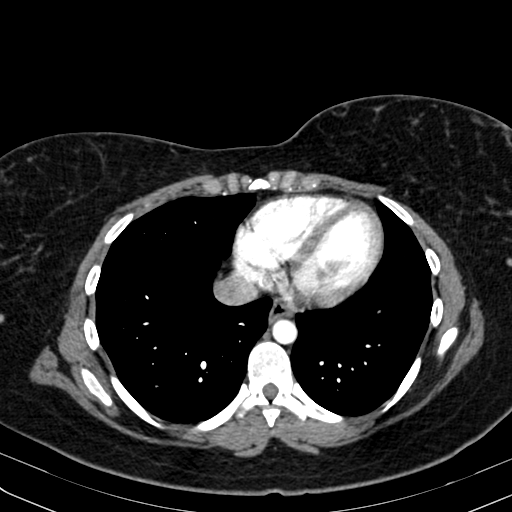
[im 103/343  lung]
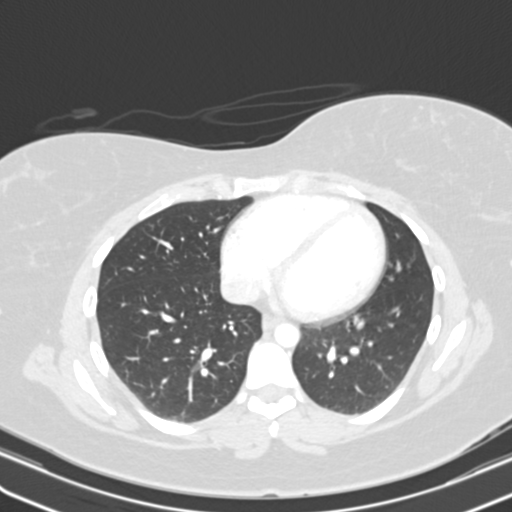
[im 115/343  mediastinal]
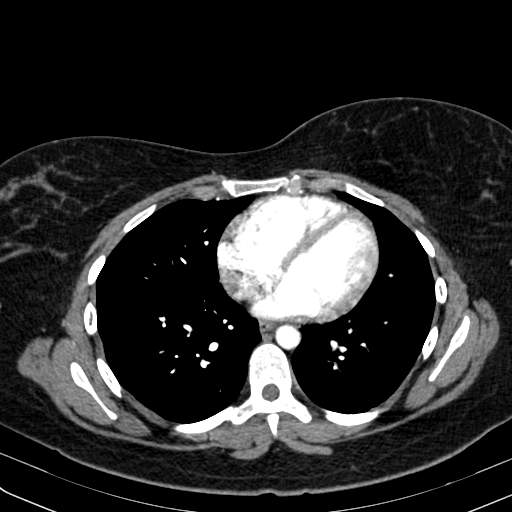
[im 120/343  lung]
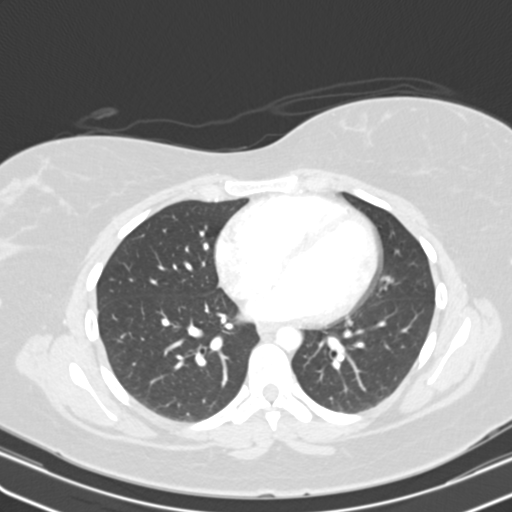
[im 137/343  mediastinal]
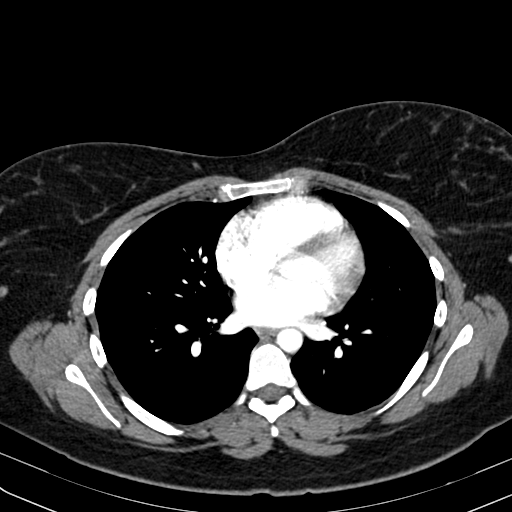
[im 154/343  lung]
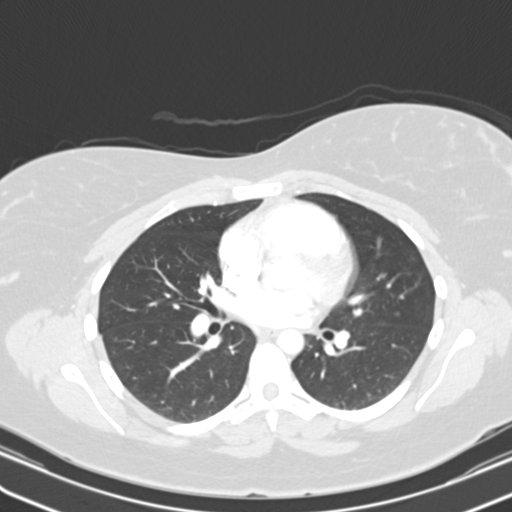
[im 172/343  mediastinal]
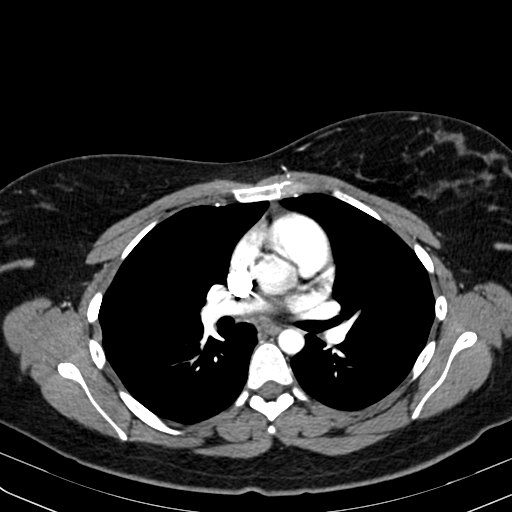
[im 189/343  lung]
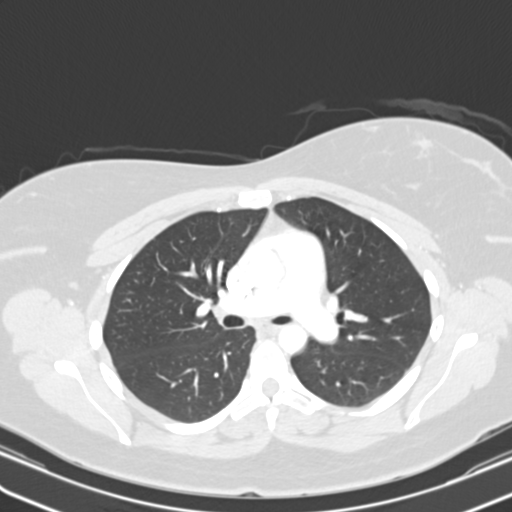
[im 206/343  mediastinal]
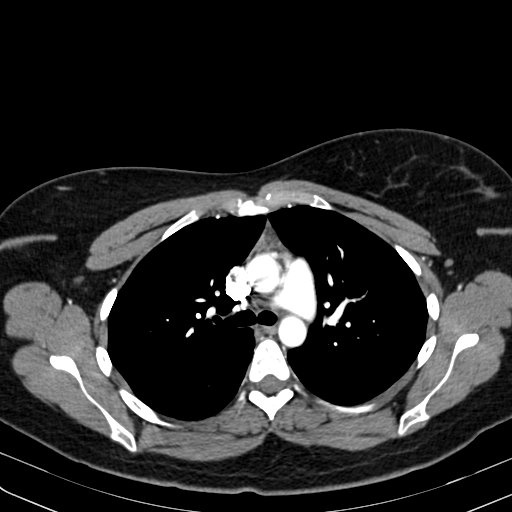
[im 223/343  lung]
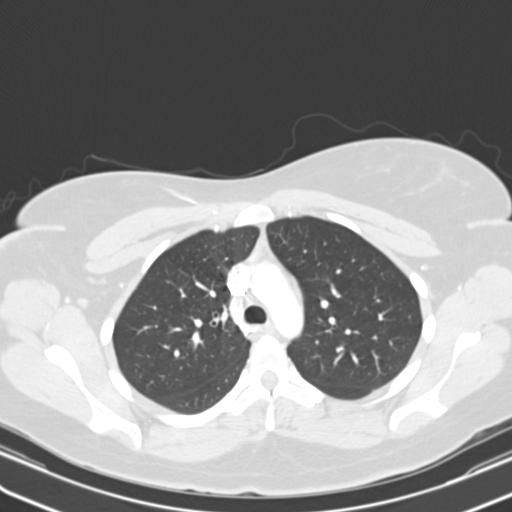
[im 229/343  mediastinal]
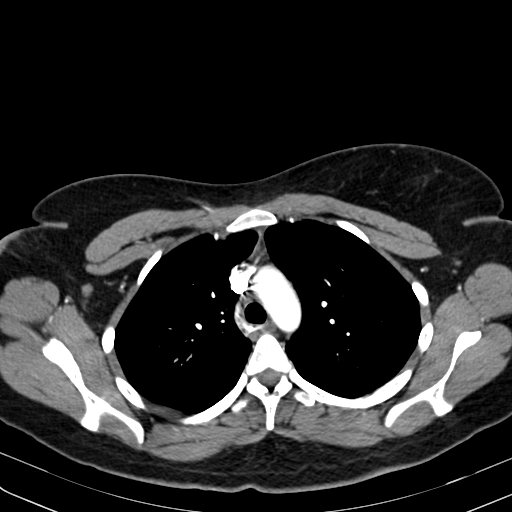
[im 240/343  lung]
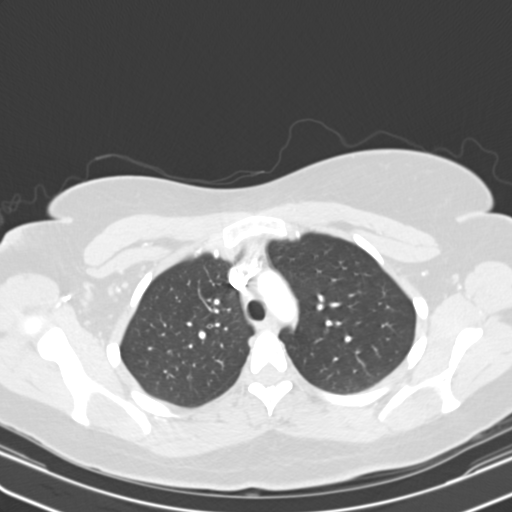
[im 257/343  mediastinal]
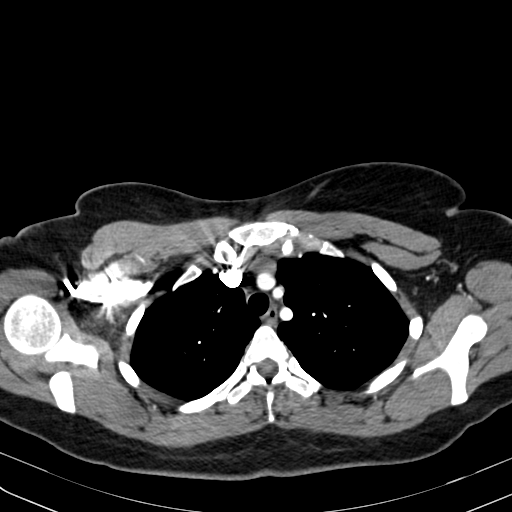
[im 291/343  lung]
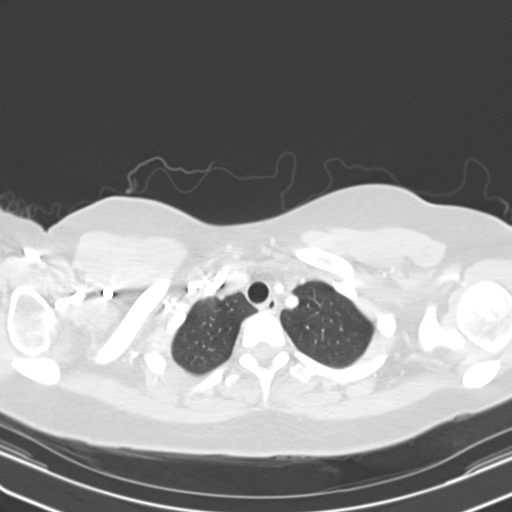
[im 308/343  mediastinal]
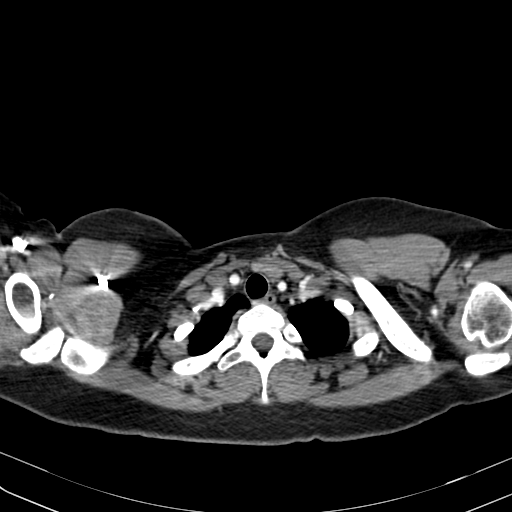
[im 325/343  lung]
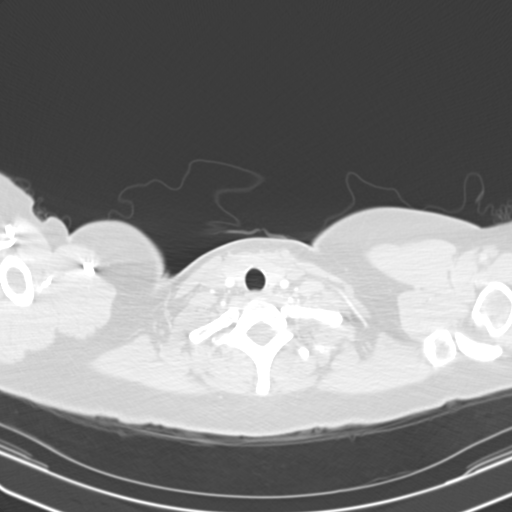

[19 of 32 positions shown; findings below may reference images not displayed]

FINDINGS: Mediastinum/Nodes: No filling defect is identified in the pulmonary
arterial tree to suggest pulmonary embolus. No acute aortic
abnormality observed. Small bilateral axillary lymph nodes are not
pathologically enlarged by size criteria. No other thoracic
adenopathy observed.

Lungs/Pleura: Unremarkable

Upper abdomen: Possible hepatic steatosis.

Musculoskeletal: Unremarkable

Review of the MIP images confirms the above findings.
IMPRESSION: 1. Possible hepatic steatosis. Otherwise, no significant
abnormalities are observed. No filling defect is identified in the
pulmonary arterial tree to suggest pulmonary embolus.

## 2015-07-31 NOTE — Telephone Encounter (Signed)
Please let her know that it may take a few months to regulate her cycle.  I need some answers from the ultrasound before I can give her something different/stop her bleeding as that may alter the results of the ultrasound. We can offer her a good PNV so that she does not become anemic and some ultram for the pain.   Thank you.

## 2015-08-01 NOTE — Telephone Encounter (Signed)
Attempted to contact the patient and left message for patient to call the office.  

## 2015-08-08 ENCOUNTER — Ambulatory Visit (HOSPITAL_COMMUNITY)
Admission: RE | Admit: 2015-08-08 | Discharge: 2015-08-08 | Disposition: A | Discharge status: Home / Self Care | Payer: 59 | Source: Ambulatory Visit | Attending: Certified Nurse Midwife | Admitting: Certified Nurse Midwife

## 2015-08-08 DIAGNOSIS — Z113 Encounter for screening for infections with a predominantly sexual mode of transmission: Secondary | ICD-10-CM | POA: Insufficient documentation

## 2015-08-08 DIAGNOSIS — N939 Abnormal uterine and vaginal bleeding, unspecified: Secondary | ICD-10-CM | POA: Insufficient documentation

## 2015-08-10 ENCOUNTER — Other Ambulatory Visit: Payer: Self-pay | Admitting: Certified Nurse Midwife

## 2015-08-10 NOTE — Telephone Encounter (Signed)
Attempted to contact the patient and left the patient and left message for patient to call the office.

## 2015-08-14 ENCOUNTER — Other Ambulatory Visit: Payer: Self-pay | Admitting: Certified Nurse Midwife

## 2015-08-15 ENCOUNTER — Telehealth: Payer: Self-pay | Admitting: *Deleted

## 2015-08-15 ENCOUNTER — Other Ambulatory Visit: Payer: Self-pay | Admitting: Certified Nurse Midwife

## 2015-08-15 DIAGNOSIS — N939 Abnormal uterine and vaginal bleeding, unspecified: Secondary | ICD-10-CM

## 2015-08-15 MED ORDER — TRANEXAMIC ACID 650 MG PO TABS: 1300.0000 mg | ORAL_TABLET | Freq: Three times a day (TID) | ORAL | Status: DC

## 2015-08-15 NOTE — Telephone Encounter (Signed)
Patient wants to talk to her provider about her ongoing menstrual problem. ( see [place] note)

## 2015-08-15 NOTE — Telephone Encounter (Signed)
Spoke with patient.  Phone call was cut off abruptly.  Discussed treatment options.  Patient did not continue taking her birth control pills because of increased bleeding/clots.  Has not stopped bleeding.  Stated she bleed the entire time with Depo injections in the past.  Given Lysteda RX.  The next step would be a referral to surgery for dx laparoscopy. Encouraged to have TSH panel repeated.

## 2015-08-15 NOTE — Telephone Encounter (Signed)
Call to patient- LM with advisement- patient called back to schedule her lab.

## 2015-08-15 NOTE — Telephone Encounter (Signed)
Patient spoke with June Leap, RN on 08-15-15

## 2015-08-17 ENCOUNTER — Telehealth: Payer: Self-pay | Admitting: *Deleted

## 2015-08-17 NOTE — Telephone Encounter (Signed)
Patient has questions about her Lysteda and would like to speak to [place].

## 2015-08-21 ENCOUNTER — Telehealth: Payer: Self-pay | Admitting: *Deleted

## 2015-08-21 NOTE — Telephone Encounter (Signed)
Patient states her medication is too expensive- $120.00 called the pharmacy and she is correct. Suggested printable coupons and I will check with Boykin Reaper about other options.

## 2015-08-22 ENCOUNTER — Ambulatory Visit (HOSPITAL_COMMUNITY): Payer: 59

## 2015-08-23 NOTE — Telephone Encounter (Signed)
[employer] has it for 30.00 at [facility] for 30 tablets with their coupon she can download and print and take to the pharmacy.  She is correct that is way too much to pay for it.  I hope she didn't do that.   http://www.goodrx.com/coupon/dispatch/[email or web address]  Thank you.

## 2015-08-29 ENCOUNTER — Telehealth: Payer: Self-pay | Admitting: *Deleted

## 2015-08-29 NOTE — Telephone Encounter (Signed)
Patient needs to talk to her provider about her ongoing issue with her birth control. She is requesting that [facility] call her.

## 2015-08-30 ENCOUNTER — Other Ambulatory Visit: Payer: Self-pay | Admitting: Certified Nurse Midwife

## 2015-08-30 DIAGNOSIS — N939 Abnormal uterine and vaginal bleeding, unspecified: Secondary | ICD-10-CM

## 2015-08-30 NOTE — Telephone Encounter (Signed)
Discussed options with patient, she declined any OCPs/Provera challenge.  Desires surgery.  Referral given.

## 2015-08-30 NOTE — Progress Notes (Unsigned)
States she has tried the [place] and it is helping with the bleeding, no clots since.  Stopped taking the pills Lysteda Tuesday.  Since then has not stopped bleeding and desires to quit bleeding.  Declines any more birth control.  Desires surgery to help stop the bleeding.  Offered Provera challenge, patient declined.  Felisa Bonier, CNM

## 2015-08-31 ENCOUNTER — Other Ambulatory Visit: Payer: Self-pay | Admitting: Certified Nurse Midwife

## 2015-09-05 ENCOUNTER — Ambulatory Visit (INDEPENDENT_AMBULATORY_CARE_PROVIDER_SITE_OTHER): Payer: 59 | Admitting: Obstetrics

## 2015-09-05 VITALS — BP 99/67 | HR 75 | Temp 98.8°F | Wt 188.8 lb

## 2015-09-05 DIAGNOSIS — N939 Abnormal uterine and vaginal bleeding, unspecified: Secondary | ICD-10-CM

## 2015-09-05 DIAGNOSIS — N946 Dysmenorrhea, unspecified: Secondary | ICD-10-CM | POA: Diagnosis not present

## 2015-09-06 ENCOUNTER — Encounter: Payer: Self-pay | Admitting: Obstetrics

## 2015-09-06 MED ORDER — IBUPROFEN 800 MG PO TABS: 800.0000 mg | ORAL_TABLET | Freq: Three times a day (TID) | ORAL | Status: DC | PRN

## 2015-09-06 NOTE — Progress Notes (Signed)
Patient ID: Tammy Hanson, female   DOB: 1986/11/18, 29 y.o.   MRN: [id number]  Chief Complaint  Patient presents with  . Advice Only    HPI Tammy Hanson is a 29 y.o. female.  Heavy and painful periods.  Undesirable side effects with all contraceptives.  Tried Lysteda without success. HPI  Past Medical History  Diagnosis Date  . Cancer     Past Surgical History  Procedure Laterality Date  . Ovarian cyst removal      Had to remove L ovary  . Oophorectomy      left  . Cesarean section    . Unilateral salpingectomy Right 2012    Family History  Problem Relation Age of Onset  . Heart disease Mother     Social History Social History  Substance Use Topics  . Smoking status: Never Smoker   . Smokeless tobacco: Never Used  . Alcohol Use: 0.0 oz/week    0 Standard drinks or equivalent per week     Comment: occ    No Known Allergies  Current Outpatient Prescriptions  Medication Sig Dispense Refill  . ALPRAZolam (XANAX) 0.25 MG tablet Take 1 tablet (0.25 mg total) by mouth at bedtime as needed for anxiety. (Patient not taking: Reported on 07/06/2015) 30 tablet 0  . ibuprofen (ADVIL,MOTRIN) 800 MG tablet Take 1 tablet (800 mg total) by mouth every 8 (eight) hours as needed. (Patient not taking: Reported on 09/05/2015) 60 tablet 1  . tranexamic acid (LYSTEDA) 650 MG TABS tablet Take 2 tablets (1,300 mg total) by mouth 3 (three) times daily. For 5 days. (Patient not taking: Reported on 09/05/2015) 30 tablet 4   No current facility-administered medications for this visit.    Review of Systems Review of Systems Constitutional: negative for fatigue and weight loss Respiratory: negative for cough and wheezing Cardiovascular: negative for chest pain, fatigue and palpitations Gastrointestinal: negative for abdominal pain and change in bowel habits Genitourinary:negative Integument/breast: negative for nipple discharge Musculoskeletal:negative for  myalgias Neurological: negative for gait problems and tremors Behavioral/Psych: negative for abusive relationship, depression Endocrine: negative for temperature intolerance     Blood pressure 99/67, pulse 75, temperature 98.8 F (37.1 C), weight 188 lb 12.8 oz (85.639 kg), last menstrual period 09/02/2015.  Physical Exam Physical Exam : Deferred  100% of 10 min visit spent on counseling and coordination of care.   Data Reviewed Labs  Assessment     AUB Dysmenorrhea     Plan    Ibuprofen Rx.   No orders of the defined types were placed in this encounter.   No orders of the defined types were placed in this encounter.

## 2015-11-02 ENCOUNTER — Ambulatory Visit: Payer: 59 | Admitting: Certified Nurse Midwife

## 2015-11-28 ENCOUNTER — Ambulatory Visit: Payer: 59 | Admitting: Obstetrics

## 2016-01-01 IMAGING — US US PELVIS COMPLETE
1 series · 15 of 25 positions shown · non-contrast
Comparison: None

CLINICAL DATA: Two months of abnormal uterine bleeding, history of
right salpingo oophorectomy

EXAM:
TRANSABDOMINAL AND TRANSVAGINAL ULTRASOUND OF PELVIS
TECHNIQUE: Both transabdominal and transvaginal ultrasound examinations of the
pelvis were performed. Transabdominal technique was performed for
global imaging of the pelvis including uterus, ovaries, adnexal
regions, and pelvic cul-de-sac. It was necessary to proceed with
endovaginal exam following the transabdominal exam to visualize the
none in PACs.

[Series 1: us pelvis complete · 15 of 28 slices shown]
[im 1/28]
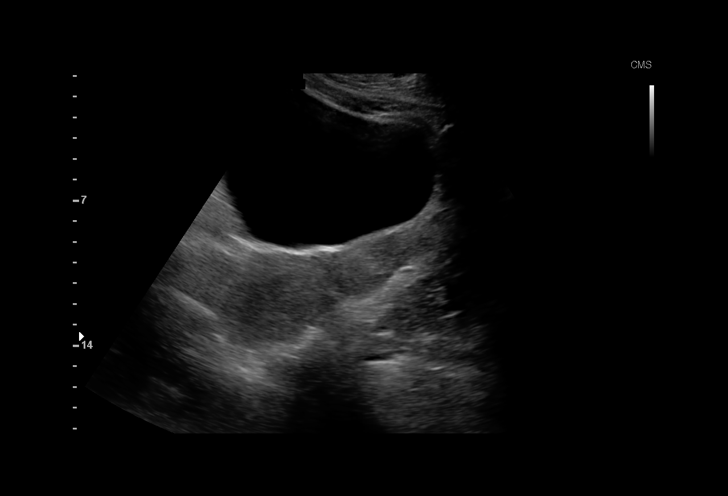
[im 3/28]
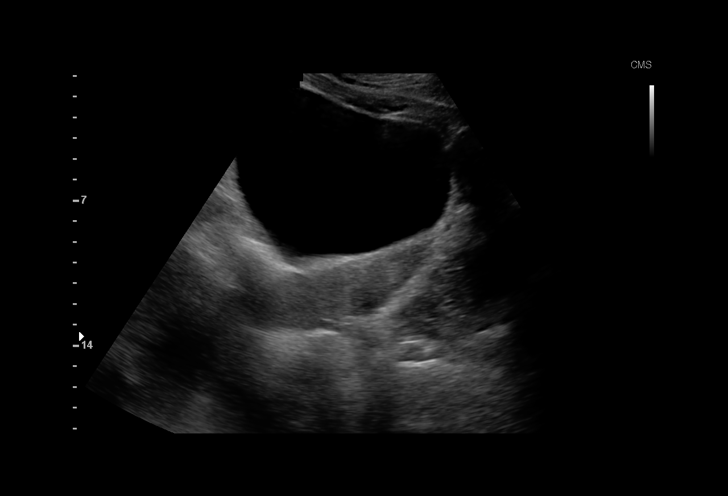
[im 5/28]
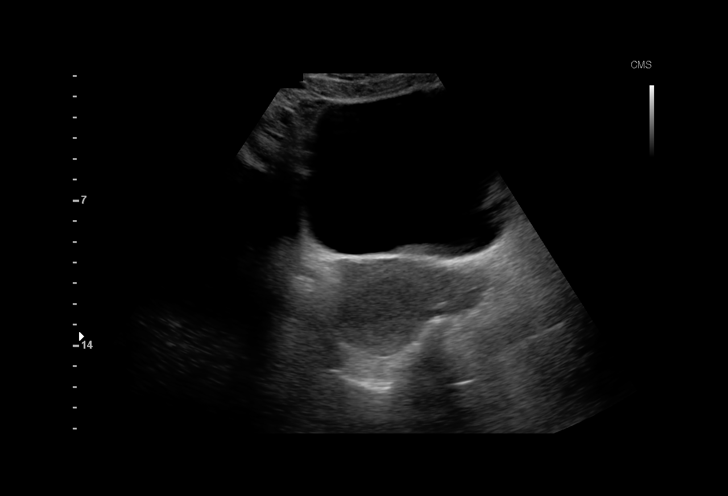
[im 6/28]
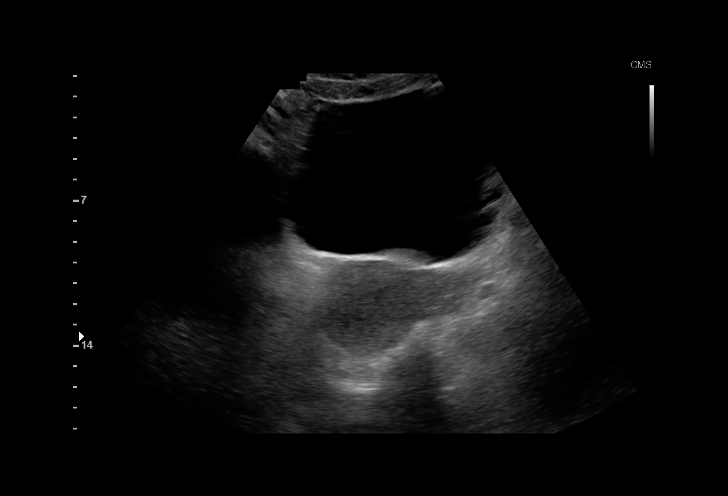
[im 8/28]
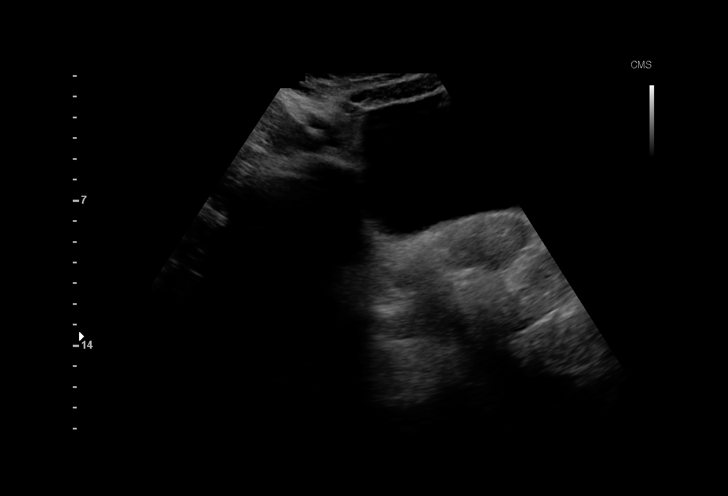
[im 11/28]
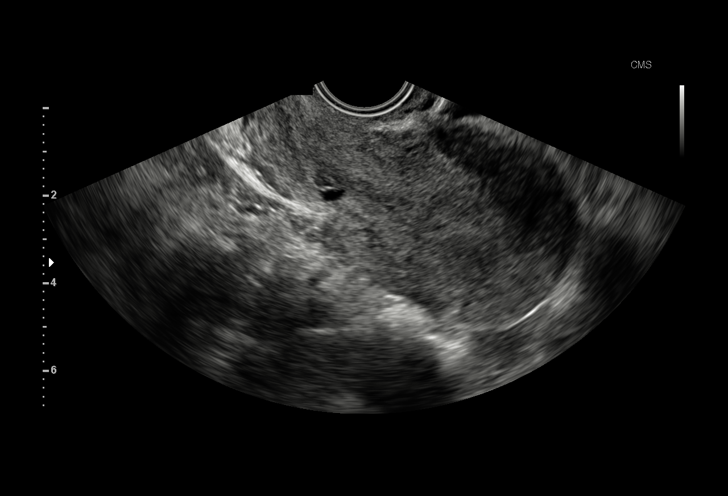
[im 12/28]
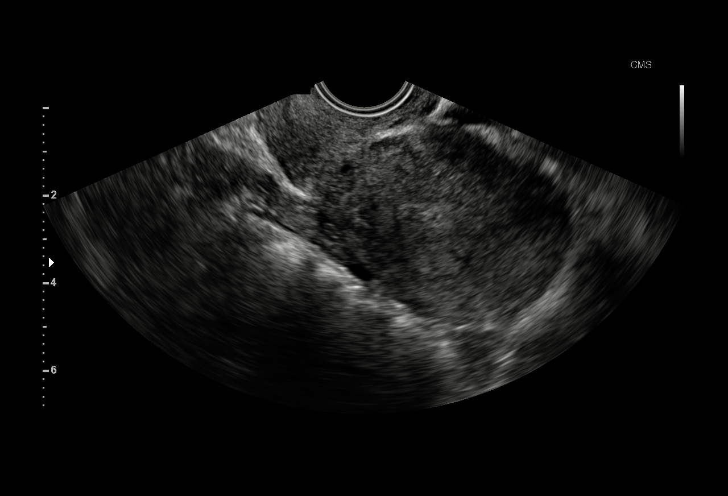
[im 14/28]
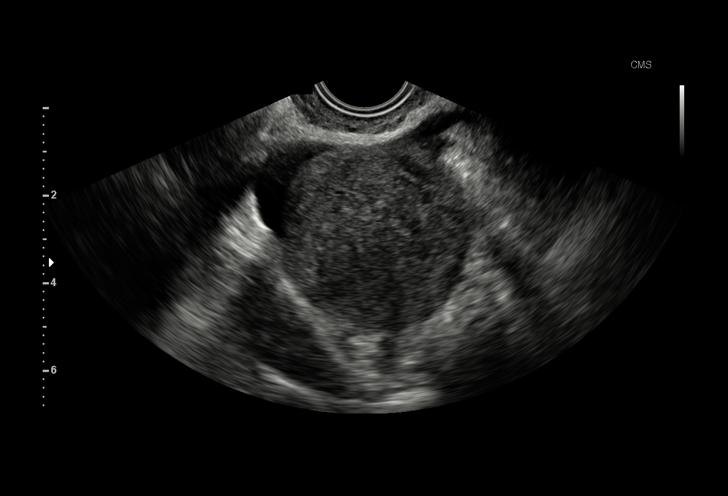
[im 16/28]
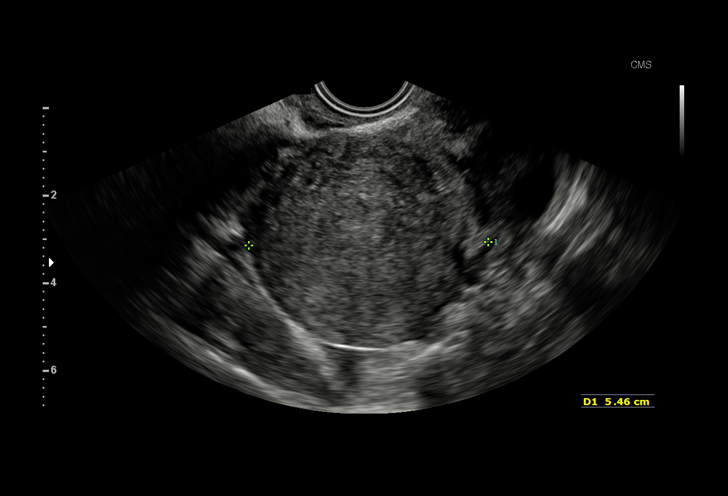
[im 17/28]
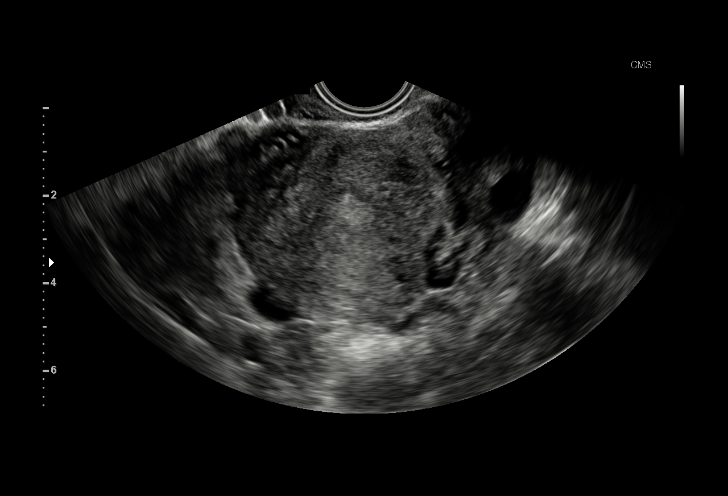
[im 20/28]
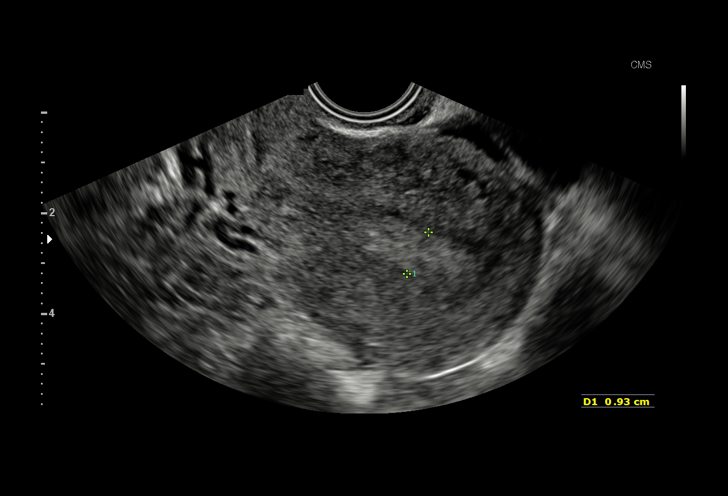
[im 22/28]
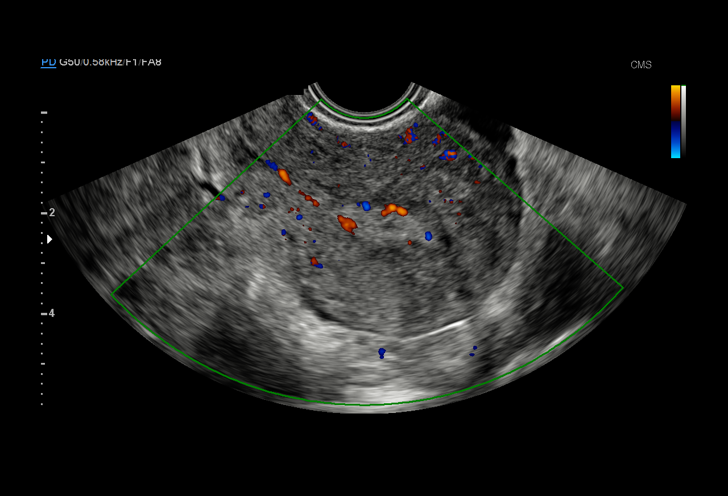
[im 23/28]
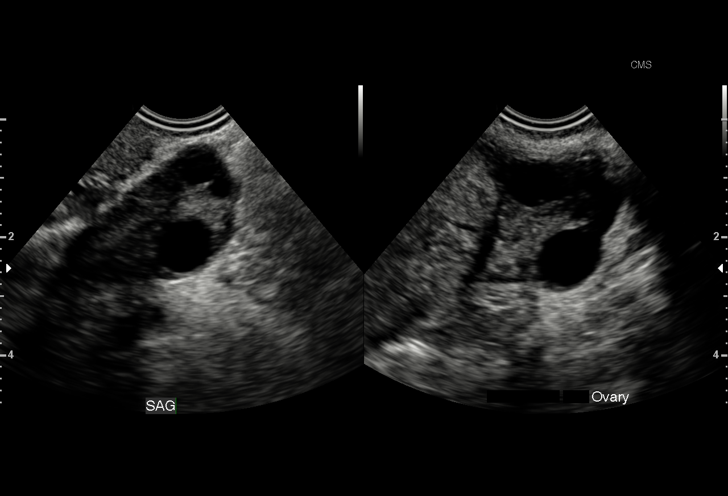
[im 25/28]
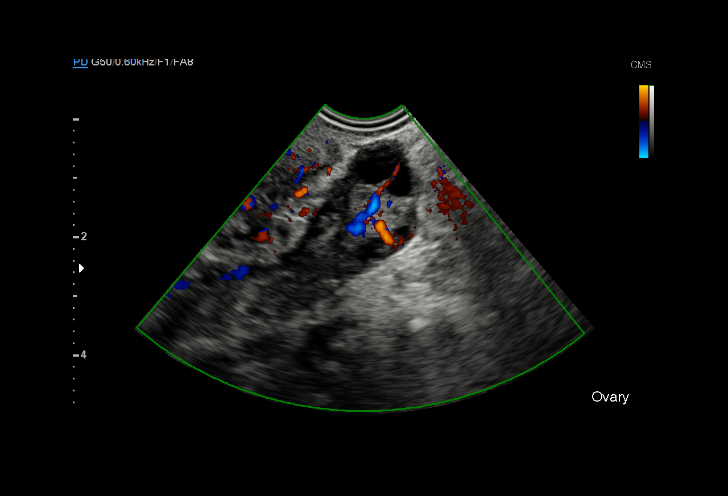
[im 28/28]
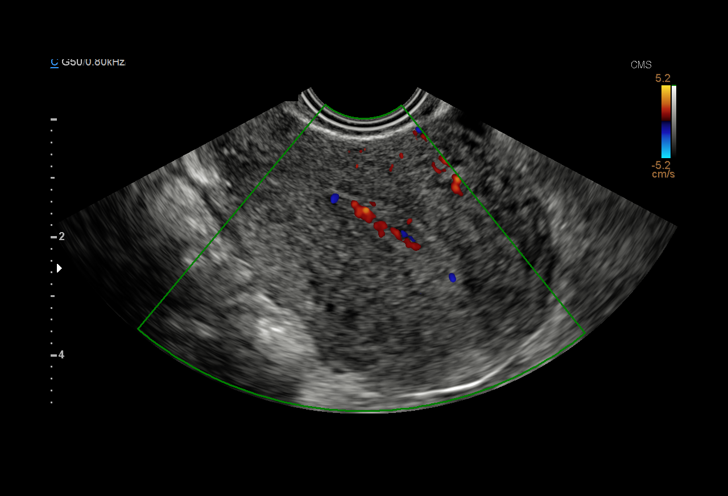

[15 of 25 positions shown; findings below may reference images not displayed]

FINDINGS: Uterus

Measurements: 8.3 x 4.5 x 5.5 cm.. The myometrial echotexture is
normal.

Endometrium

Thickness: 9.7 cm. No endometrial masses are observed. The
endometrium is vascular.

Right ovary

The right ovary surgically absent.

Left ovary

Measurements: 3.3 x 2.1 x 2.0 cm. Normal appearance/no adnexal mass.

Other findings

There is a trace of free pelvic fluid.
IMPRESSION: 1. The endometrium is not abnormally thickened. It exhibits mildly
increased vascularity. There is no focal endometrial mass. If
bleeding remains unresponsive to hormonal or medical therapy,
sonohysterogram should be considered for focal lesion work-up. (Ref:
Radiological Reasoning: Algorithmic Workup of Abnormal Vaginal
Bleeding with Endovaginal Sonography and Sonohysterography. AJR
6116; 191:S68-73).
2. The left ovary is unremarkable. The right ovary is surgically
absent. There is a trace of free pelvic fluid.

## 2016-08-17 ENCOUNTER — Encounter (HOSPITAL_COMMUNITY): Payer: Self-pay | Admitting: *Deleted

## 2016-08-17 ENCOUNTER — Ambulatory Visit (HOSPITAL_COMMUNITY)
Admission: EM | Admit: 2016-08-17 | Discharge: 2016-08-17 | Disposition: A | Discharge status: Home / Self Care | Payer: 59 | Attending: Family Medicine | Admitting: Family Medicine

## 2016-08-17 DIAGNOSIS — J029 Acute pharyngitis, unspecified: Secondary | ICD-10-CM | POA: Insufficient documentation

## 2016-08-17 DIAGNOSIS — J0101 Acute recurrent maxillary sinusitis: Secondary | ICD-10-CM | POA: Diagnosis not present

## 2016-08-17 LAB — POCT RAPID STREP A: Streptococcus, Group A Screen (Direct): NEGATIVE

## 2016-08-17 MED ORDER — IPRATROPIUM BROMIDE 0.06 % NA SOLN: 2.0000 | Freq: Four times a day (QID) | NASAL | 1 refills | Status: DC

## 2016-08-17 MED ORDER — AZITHROMYCIN 250 MG PO TABS: ORAL_TABLET | ORAL | 0 refills | Status: DC

## 2016-08-17 NOTE — ED Provider Notes (Signed)
[facility]    CSN: [id number] Arrival date & time: 08/17/16  1157  First Provider Contact:  First MD Initiated Contact with Patient 08/17/16 1228        History   Chief Complaint Chief Complaint  Patient presents with  . Sore Throat    HPI Tammy Hanson is a 30 y.o. female.    URI  Presenting symptoms: congestion, rhinorrhea and sore throat   Presenting symptoms: no cough and no fever   Severity:  Moderate Onset quality:  Sudden Duration:  3 days Chronicity:  New Relieved by:  None tried Worsened by:  Nothing Ineffective treatments:  None tried Risk factors: no recent illness, no recent travel and no sick contacts     Past Medical History:  Diagnosis Date  . Cancer (HCC)     There are no active problems to display for this patient.   Past Surgical History:  Procedure Laterality Date  . CESAREAN SECTION    . OOPHORECTOMY     left  . OVARIAN CYST REMOVAL     Had to remove L ovary  . UNILATERAL SALPINGECTOMY Right 2012    OB History    Gravida Para Term Preterm AB Living   4 1 1   3 1    SAB TAB Ectopic Multiple Live Births   3       1       Home Medications    Prior to Admission medications   Medication Sig Start Date End Date Taking? Authorizing Provider  ALPRAZolam (XANAX) 0.25 MG tablet Take 1 tablet (0.25 mg total) by mouth at bedtime as needed for anxiety. Patient not taking: Reported on 07/06/2015 03/07/15   Duane Lope, NP  ibuprofen (ADVIL,MOTRIN) 800 MG tablet Take 1 tablet (800 mg total) by mouth every 8 (eight) hours as needed. Patient not taking: Reported on 09/05/2015 07/06/15   Rachelle A Denney, CNM  ibuprofen (ADVIL,MOTRIN) 800 MG tablet Take 1 tablet (800 mg total) by mouth every 8 (eight) hours as needed. 09/06/15   Brock Bad, MD  tranexamic acid (LYSTEDA) 650 MG TABS tablet Take 2 tablets (1,300 mg total) by mouth 3 (three) times daily. For 5 days. Patient not taking: Reported on 09/05/2015 08/15/15    Roe Coombs, CNM    Family History Family History  Problem Relation Age of Onset  . Heart disease Mother     Social History Social History  Substance Use Topics  . Smoking status: Never Smoker  . Smokeless tobacco: Never Used  . Alcohol use 0.0 oz/week     Comment: occ     Allergies   Review of patient's allergies indicates no known allergies.   Review of Systems Review of Systems  Constitutional: Negative.  Negative for fever.  HENT: Positive for congestion, postnasal drip, rhinorrhea and sore throat.   Respiratory: Negative for cough and shortness of breath.   All other systems reviewed and are negative.    Physical Exam Triage Vital Signs ED Triage Vitals [08/17/16 1219]  Enc Vitals Group     BP 110/66     Pulse Rate 68     Resp 16     Temp 97.9 F (36.6 C)     Temp Source Oral     SpO2 100 %     Weight      Height      Head Circumference      Peak Flow      Pain Score  Pain Loc      Pain Edu?      Excl. in GC?    No data found.   Updated Vital Signs BP 110/66 (BP Location: Left Arm)   Pulse 68   Temp 97.9 F (36.6 C) (Oral)   Resp 16   SpO2 100%   Visual Acuity Right Eye Distance:   Left Eye Distance:   Bilateral Distance:    Right Eye Near:   Left Eye Near:    Bilateral Near:     Physical Exam  Constitutional: She is oriented to person, place, and time. She appears well-developed and well-nourished.  HENT:  Right Ear: External ear normal.  Left Ear: External ear normal.  Nose: Nose normal.  Mouth/Throat: Oropharynx is clear and moist.  Eyes: Conjunctivae and EOM are normal. Pupils are equal, round, and reactive to light.  Neck: Normal range of motion. Neck supple.  Cardiovascular: Normal rate, regular rhythm, normal heart sounds and intact distal pulses.   Pulmonary/Chest: Effort normal and breath sounds normal.  Lymphadenopathy:    She has no cervical adenopathy.  Neurological: She is alert and oriented to person,  place, and time.  Skin: Skin is warm and dry.  Nursing note and vitals reviewed.    UC Treatments / Results  Labs (all labs ordered are listed, but only abnormal results are displayed) Labs Reviewed - No data to display  EKG  EKG Interpretation None       Radiology No results found.  Procedures Procedures (including critical care time)  Medications Ordered in UC Medications - No data to display   Initial Impression / Assessment and Plan / UC Course  I have reviewed the triage vital signs and the nursing notes.  Pertinent labs & imaging results that were available during my care of the patient were reviewed by me and considered in my medical decision making (see chart for details).  Clinical Course      Final Clinical Impressions(s) / UC Diagnoses   Final diagnoses:  None    New Prescriptions New Prescriptions   No medications on file     Linna HoffJames D Ziere Docken, MD 08/17/16 1241

## 2016-08-20 LAB — CULTURE, GROUP A STREP (THRC)

## 2016-11-05 ENCOUNTER — Inpatient Hospital Stay (HOSPITAL_COMMUNITY)
Admission: AD | Admit: 2016-11-05 | Discharge: 2016-11-05 | Disposition: A | Discharge status: Home / Self Care | Payer: 59 | Source: Ambulatory Visit | Attending: Family Medicine | Admitting: Family Medicine

## 2016-11-05 ENCOUNTER — Encounter (HOSPITAL_COMMUNITY): Payer: Self-pay | Admitting: *Deleted

## 2016-11-05 ENCOUNTER — Inpatient Hospital Stay (HOSPITAL_COMMUNITY): Payer: Managed Care, Other (non HMO)

## 2016-11-05 DIAGNOSIS — O208 Other hemorrhage in early pregnancy: Secondary | ICD-10-CM | POA: Insufficient documentation

## 2016-11-05 DIAGNOSIS — O26891 Other specified pregnancy related conditions, first trimester: Secondary | ICD-10-CM | POA: Insufficient documentation

## 2016-11-05 DIAGNOSIS — O99011 Anemia complicating pregnancy, first trimester: Secondary | ICD-10-CM | POA: Diagnosis not present

## 2016-11-05 DIAGNOSIS — Z640 Problems related to unwanted pregnancy: Secondary | ICD-10-CM

## 2016-11-05 DIAGNOSIS — Z3A09 9 weeks gestation of pregnancy: Secondary | ICD-10-CM | POA: Insufficient documentation

## 2016-11-05 DIAGNOSIS — B9689 Other specified bacterial agents as the cause of diseases classified elsewhere: Secondary | ICD-10-CM | POA: Insufficient documentation

## 2016-11-05 DIAGNOSIS — N760 Acute vaginitis: Secondary | ICD-10-CM | POA: Insufficient documentation

## 2016-11-05 DIAGNOSIS — O209 Hemorrhage in early pregnancy, unspecified: Secondary | ICD-10-CM

## 2016-11-05 DIAGNOSIS — R109 Unspecified abdominal pain: Secondary | ICD-10-CM

## 2016-11-05 DIAGNOSIS — O418X10 Other specified disorders of amniotic fluid and membranes, first trimester, not applicable or unspecified: Secondary | ICD-10-CM

## 2016-11-05 DIAGNOSIS — O468X1 Other antepartum hemorrhage, first trimester: Secondary | ICD-10-CM

## 2016-11-05 HISTORY — DX: Unspecified ovarian cyst, unspecified side: N83.209

## 2016-11-05 HISTORY — DX: Anxiety disorder, unspecified: F41.9

## 2016-11-05 LAB — CBC
HEMATOCRIT: 27.6 % — AB (ref 36.0–46.0)
HEMOGLOBIN: 8.9 g/dL — AB (ref 12.0–15.0)
MCH: 24.1 pg — ABNORMAL LOW (ref 26.0–34.0)
MCHC: 32.2 g/dL (ref 30.0–36.0)
MCV: 74.8 fL — AB (ref 78.0–100.0)
Platelets: 248 10*3/uL (ref 150–400)
RBC: 3.69 MIL/uL — ABNORMAL LOW (ref 3.87–5.11)
RDW: 18.7 % — ABNORMAL HIGH (ref 11.5–15.5)
WBC: 7.6 10*3/uL (ref 4.0–10.5)

## 2016-11-05 LAB — URINALYSIS, ROUTINE W REFLEX MICROSCOPIC
Bilirubin Urine: NEGATIVE
Glucose, UA: NEGATIVE mg/dL
HGB URINE DIPSTICK: NEGATIVE
Ketones, ur: NEGATIVE mg/dL
Leukocytes, UA: NEGATIVE
Nitrite: NEGATIVE
PH: 5.5 (ref 5.0–8.0)
Protein, ur: NEGATIVE mg/dL
SPECIFIC GRAVITY, URINE: 1.01 (ref 1.005–1.030)

## 2016-11-05 LAB — WET PREP, GENITAL
Sperm: NONE SEEN
Trich, Wet Prep: NONE SEEN
YEAST WET PREP: NONE SEEN

## 2016-11-05 LAB — HCG, QUANTITATIVE, PREGNANCY: HCG, BETA CHAIN, QUANT, S: 83990 m[IU]/mL — AB (ref <5)

## 2016-11-05 LAB — POCT PREGNANCY, URINE: PREG TEST UR: POSITIVE — AB

## 2016-11-05 LAB — ABO/RH: ABO/RH(D): A NEG

## 2016-11-05 MED ORDER — METRONIDAZOLE 500 MG PO TABS: 500.0000 mg | ORAL_TABLET | Freq: Two times a day (BID) | ORAL | 0 refills | Status: DC

## 2016-11-05 MED ORDER — FERROUS SULFATE 325 (65 FE) MG PO TABS: 325.0000 mg | ORAL_TABLET | Freq: Every day | ORAL | 0 refills | Status: DC

## 2016-11-05 MED ORDER — RHO D IMMUNE GLOBULIN 1500 UNIT/2ML IJ SOSY
300.0000 ug | PREFILLED_SYRINGE | Freq: Once | INTRAMUSCULAR | Status: AC
  Administered 2016-11-05: 300 ug via INTRAMUSCULAR
  Filled 2016-11-05: qty 2

## 2016-11-05 NOTE — Discharge Instructions (Signed)
Bacterial Vaginosis Bacterial vaginosis is an infection of the vagina. It happens when too many germs (bacteria) grow in the vagina. This infection puts you at risk for infections from sex (STIs). Treating this infection can lower your risk for some STIs. You should also treat this if you are pregnant. It can cause your baby to be born early. Follow these instructions at home: Medicines  Take over-the-counter and prescription medicines only as told by your doctor.  Take or use your antibiotic medicine as told by your doctor. Do not stop taking or using it even if you start to feel better. General instructions  If you your sexual partner is a woman, tell her that you have this infection. She needs to get treatment if she has symptoms. If you have a female partner, he does not need to be treated.  During treatment:  Avoid sex.  Do not douche.  Avoid alcohol as told.  Avoid breastfeeding as told.  Drink enough fluid to keep your pee (urine) clear or pale yellow.  Keep your vagina and butt (rectum) clean.  Wash the area with warm water every day.  Wipe from front to back after you use the toilet.  Keep all follow-up visits as told by your doctor. This is important. Preventing this condition  Do not douche.  Use only warm water to wash around your vagina.  Use protection when you have sex. This includes:  Latex condoms.  Dental dams.  Limit how many people you have sex with. It is best to only have sex with the same person (be monogamous).  Get tested for STIs. Have your partner get tested.  Wear underwear that is cotton or lined with cotton.  Avoid tight pants and pantyhose. This is most important in summer.  Do not use any products that have nicotine or tobacco in them. These include cigarettes and e-cigarettes. If you need help quitting, ask your doctor.  Do not use illegal drugs.  Limit how much alcohol you drink. Contact a doctor if:  Your symptoms do not get  better, even after you are treated.  You have more discharge or pain when you pee (urinate).  You have a fever.  You have pain in your belly (abdomen).  You have pain with sex.  Your bleed from your vagina between periods. Summary  This infection happens when too many germs (bacteria) grow in the vagina.  Treating this condition can lower your risk for some infections from sex (STIs).  You should also treat this if you are pregnant. It can cause early (premature) birth.  Do not stop taking or using your antibiotic medicine even if you start to feel better. This information is not intended to replace advice given to you by your health care provider. Make sure you discuss any questions you have with your health care provider. Document Released: 09/09/2008 Document Revised: 08/16/2016 Document Reviewed: 08/16/2016 Elsevier Interactive Patient Education  2017 [employer] Subchorionic Hematoma A subchorionic hematoma is a gathering of blood between the outer wall of the placenta and the inner wall of the womb (uterus). The placenta is the organ that connects the fetus to the wall of the uterus. The placenta performs the feeding, breathing (oxygen to the fetus), and waste removal (excretory work) of the fetus.  Subchorionic hematoma is the most common abnormality found on a result from ultrasonography done during the first trimester or early second trimester of pregnancy. If there has been little or no vaginal bleeding, early small hematomas  usually shrink on their own and do not affect your baby or pregnancy. The blood is gradually absorbed over 1-2 weeks. When bleeding starts later in pregnancy or the hematoma is larger or occurs in an older pregnant woman, the outcome may not be as good. Larger hematomas may get bigger, which increases the chances for miscarriage. Subchorionic hematoma also increases the risk of premature detachment of the placenta from the uterus, preterm (premature) labor,  and stillbirth. HOME CARE INSTRUCTIONS  Stay on bed rest if your health care provider recommends this. Although bed rest will not prevent more bleeding or prevent a miscarriage, your health care provider may recommend bed rest until you are advised otherwise.  Avoid heavy lifting (more than 10 lb [4.5 kg]), exercise, sexual intercourse, or douching as directed by your health care provider.  Keep track of the number of pads you use each day and how soaked (saturated) they are. Write down this information.  Do not use tampons.  Keep all follow-up appointments as directed by your health care provider. Your health care provider may ask you to have follow-up blood tests or ultrasound tests or both. SEEK IMMEDIATE MEDICAL CARE IF:  You have severe cramps in your stomach, back, abdomen, or pelvis.  You have a fever.  You pass large clots or tissue. Save any tissue for your health care provider to look at.  Your bleeding increases or you become lightheaded, feel weak, or have fainting episodes. This information is not intended to replace advice given to you by your health care provider. Make sure you discuss any questions you have with your health care provider. Document Released: 03/18/2007 Document Revised: 12/22/2014 Document Reviewed: 06/30/2013 Elsevier Interactive Patient Education  2017 [employer]

## 2016-11-05 NOTE — MAU Provider Note (Signed)
History     CSN: [id number]  Arrival date and time: 11/05/16 4098   First Provider Initiated Contact with Patient 11/05/16 [phone]      Chief Complaint  Patient presents with  . Abdominal Pain   HPI  Ms.Tammy Hanson is a 30 y.o. female [phone] @ [redacted]w[redacted]d with a history of left oophorectomy and salpingectomy here in [facility]U with abdominal pain. The pain is worse on the right side. The pain radiates to her lower back. The pain was irregular, however now feels more constant. The pain is relieved by heat; she has been using a heating pad intermittently.  This is an un wanted pregnancy and the patient plans to terminate.   OB History    Gravida Para Term Preterm AB Living   5 1 1   3 1    SAB TAB Ectopic Multiple Live Births   3       1      Past Medical History:  Diagnosis Date  . Anxiety   . Ovarian cyst     Past Surgical History:  Procedure Laterality Date  . CESAREAN SECTION    . LAPAROSCOPIC OVARIAN CYSTECTOMY    . OOPHORECTOMY     left  . OVARIAN CYST REMOVAL     Had to remove L ovary  . UNILATERAL SALPINGECTOMY Right 2012    Family History  Problem Relation Age of Onset  . Heart disease Mother     Social History  Substance Use Topics  . Smoking status: Never Smoker  . Smokeless tobacco: Never Used  . Alcohol use 0.0 oz/week     Comment: occ    Allergies: No Known Allergies  Prescriptions Prior to Admission  Medication Sig Dispense Refill Last Dose  . ibuprofen (ADVIL,MOTRIN) 800 MG tablet Take 800 mg by mouth every 8 (eight) hours as needed for mild pain or moderate pain.   Past Week at Unknown time   Results for orders placed or performed during the hospital encounter of 11/05/16 (from the past 48 hour(s))  Urinalysis, Routine w reflex microscopic (not at [facility])     Status: None   Collection Time: 11/05/16  8:15 AM  Result Value Ref Range   Color, Urine YELLOW YELLOW   APPearance CLEAR CLEAR   Specific Gravity, Urine 1.010 1.005 - 1.030   pH 5.5 5.0  - 8.0   Glucose, UA NEGATIVE NEGATIVE mg/dL   Hgb urine dipstick NEGATIVE NEGATIVE   Bilirubin Urine NEGATIVE NEGATIVE   Ketones, ur NEGATIVE NEGATIVE mg/dL   Protein, ur NEGATIVE NEGATIVE mg/dL   Nitrite NEGATIVE NEGATIVE   Leukocytes, UA NEGATIVE NEGATIVE    Comment: MICROSCOPIC NOT DONE ON URINES WITH NEGATIVE PROTEIN, BLOOD, LEUKOCYTES, NITRITE, OR GLUCOSE <1000 mg/dL.  Pregnancy, urine POC     Status: Abnormal   Collection Time: 11/05/16  8:22 AM  Result Value Ref Range   Preg Test, Ur POSITIVE (A) NEGATIVE    Comment:        THE SENSITIVITY OF THIS METHODOLOGY IS >24 mIU/mL   ABO/Rh     Status: None   Collection Time: 11/05/16  9:46 AM  Result Value Ref Range   ABO/RH(D) A NEG   Wet prep, genital     Status: Abnormal   Collection Time: 11/05/16  9:46 AM  Result Value Ref Range   Yeast Wet Prep HPF POC NONE SEEN NONE SEEN   Trich, Wet Prep NONE SEEN NONE SEEN   Clue Cells Wet Prep HPF POC PRESENT (A)  NONE SEEN   WBC, Wet Prep HPF POC FEW (A) NONE SEEN    Comment: MANY BACTERIA SEEN   Sperm NONE SEEN   CBC     Status: Abnormal   Collection Time: 11/05/16  9:47 AM  Result Value Ref Range   WBC 7.6 4.0 - 10.5 K/uL   RBC 3.69 (L) 3.87 - 5.11 MIL/uL   Hemoglobin 8.9 (L) 12.0 - 15.0 g/dL   HCT 91.427.6 (L) 78.236.0 - 95.646.0 %   MCV 74.8 (L) 78.0 - 100.0 fL   MCH 24.1 (L) 26.0 - 34.0 pg   MCHC 32.2 30.0 - 36.0 g/dL   RDW 21.318.7 (H) 08.611.5 - 57.815.5 %   Platelets 248 150 - 400 K/uL  hCG, quantitative, pregnancy     Status: Abnormal   Collection Time: 11/05/16  9:47 AM  Result Value Ref Range   hCG, Beta Chain, Quant, S 83,990 (H) <5 mIU/mL    Comment:          GEST. AGE      CONC.  (mIU/mL)   <=1 WEEK        5 - 50     2 WEEKS       50 - 500     3 WEEKS       100 - 10,000     4 WEEKS     1,000 - 30,000     5 WEEKS     3,500 - 115,000   6-8 WEEKS     12,000 - 270,000    12 WEEKS     15,000 - 220,000        FEMALE AND NON-PREGNANT FEMALE:     LESS THAN 5 mIU/mL   Rh IG workup  (includes ABO/Rh)     Status: None (Preliminary result)   Collection Time: 11/05/16  9:47 AM  Result Value Ref Range   Gestational Age(Wks) 9    ABO/RH(D) A NEG    Antibody Screen NEG    Unit Number [id number]    Blood Component Type RHIG    Unit division 00    Status of Unit ISSUED    Transfusion Status OK TO TRANSFUSE    [place] Ob Comp Less 14 Wks  Result Date: 11/05/2016 CLINICAL DATA:  Vaginal bleeding in pregnancy, first trimester EXAM: OBSTETRIC <14 WK US AND TRANSVAGINAL OB US TECHNIQUE: Both transabdominal and transvaginal ultrasound examinations were performed for complete evaluation of the gestation as well as the maternal uterus, adnexal regions, and pelvic cul-de-sac. Transvaginal technique was performed to assess early pregnancy. COMPARISON:  None. FINDINGS: Intrauterine gestational sac: Visualized, single Yolk sac:  Visualized Embryo:  Visualized Cardiac Activity: Visualized Heart Rate: 173  bpm MSD:   mm    w     d CRL:  26.4  mm   9 w   3 d                  US EDC: 06/07/2017 Subchorionic hemorrhage: Small subchorionic hemorrhage, 2.7 x 2.3 x 1.2 cm Maternal uterus/adnexae: No adnexal masses. Left ovarian corpus luteal cyst. Trace free fluid noted in the left adnexa. IMPRESSION: Nine week 3 day intrauterine pregnancy. Fetal heart rate 173 beats per minute. Small subchorionic hemorrhage. Electronically Signed   By: Charlett NoseKevin  Dover M.D.   On: 11/05/2016 10:42   [place] Ob Transvaginal  Result Date: 11/05/2016 CLINICAL DATA:  Vaginal bleeding in pregnancy, first trimester EXAM: OBSTETRIC <14 WK US AND TRANSVAGINAL OB US TECHNIQUE: Both transabdominal and  transvaginal ultrasound examinations were performed for complete evaluation of the gestation as well as the maternal uterus, adnexal regions, and pelvic cul-de-sac. Transvaginal technique was performed to assess early pregnancy. COMPARISON:  None. FINDINGS: Intrauterine gestational sac: Visualized, single Yolk sac:  Visualized Embryo:   Visualized Cardiac Activity: Visualized Heart Rate: 173  bpm MSD:   mm    w     d CRL:  26.4  mm   9 w   3 d                  US EDC: 06/07/2017 Subchorionic hemorrhage: Small subchorionic hemorrhage, 2.7 x 2.3 x 1.2 cm Maternal uterus/adnexae: No adnexal masses. Left ovarian corpus luteal cyst. Trace free fluid noted in the left adnexa. IMPRESSION: Nine week 3 day intrauterine pregnancy. Fetal heart rate 173 beats per minute. Small subchorionic hemorrhage. Electronically Signed   By: Charlett NoseKevin  Dover M.D.   On: 11/05/2016 10:42    Review of Systems  Constitutional: Negative for fever.  Gastrointestinal: Positive for abdominal pain and nausea. Negative for vomiting.  Genitourinary: Negative for dysuria.   Physical Exam   Blood pressure 110/61, pulse 75, temperature 98.5 F (36.9 C), temperature source Oral, resp. rate 18, height 5\' 7"  (1.702 m), weight 195 lb (88.5 kg), last menstrual period 09/08/2016.  Physical Exam  Constitutional: She is oriented to person, place, and time. She appears well-developed and well-nourished. No distress.  HENT:  Head: Normocephalic.  Eyes: Pupils are equal, round, and reactive to light.  GI: Soft. There is tenderness in the right lower quadrant, suprapubic area and left lower quadrant. There is no rigidity, no rebound and no guarding.  Musculoskeletal: Normal range of motion.  Neurological: She is alert and oriented to person, place, and time.  Skin: Skin is warm. She is not diaphoretic.  Psychiatric: Her behavior is normal.    MAU Course  Procedures None  MDM  UA UPT + CBC, Hcg, ABO, US A negative blood type: Rhogam given   Assessment and Plan   A:  1. Anemia in pregnancy, first trimester   2. Vaginal bleeding in pregnancy, first trimester   3. Subchorionic hematoma in first trimester, single or unspecified fetus   4. BV (bacterial vaginosis)   5. Unwanted pregnancy with plans for termination   6. Abdominal pain in pregnancy, first trimester      P: SIUP @ 9w 3d  Discharge home in stable condition Bleeding precautions Return to MAU if symptoms worsen  Rx: Iron pills, Flagyl     Duane LopeJennifer I Rasch, NP 11/05/2016 7:04 PM

## 2016-11-05 NOTE — MAU Provider Note (Signed)
History     CSN: [id number]  Arrival date and time: 11/05/16 4098   First Provider Initiated Contact with Patient 11/05/16 [phone]      Chief Complaint  Patient presents with  . Abdominal Pain   Abdominal Pain  This is a recurrent problem. The current episode started more than 1 month ago. The onset quality is gradual. The problem occurs intermittently. The most recent episode lasted 1 day. The problem has been waxing and waning. The pain is located in the right flank. The pain is at a severity of 8/10. The pain is severe. The quality of the pain is sharp. The abdominal pain radiates to the LLQ, LUQ, RLQ, RUQ and back. Associated symptoms include anorexia, nausea and vomiting. Pertinent negatives include no constipation, diarrhea, dysuria, fever, frequency, headaches, hematuria, melena or weight loss. Exacerbated by: exertion. The pain is relieved by movement (heating pad). Treatments tried: ibuprofen. The treatment provided mild relief. Her past medical history is significant for abdominal surgery.  Back Pain  This is a chronic problem. The current episode started more than 1 year ago. The problem occurs constantly. The problem is unchanged. The pain is present in the lumbar spine. The quality of the pain is described as aching, burning, cramping, shooting and stabbing. The pain does not radiate. The pain is at a severity of 10/10. The pain is severe. The pain is worse during the night. Stiffness is present all day. Associated symptoms include abdominal pain and weakness. Pertinent negatives include no bladder incontinence, bowel incontinence, dysuria, fever, headaches, numbness, paresthesias, tingling or weight loss. Risk factors include pregnancy and obesity. She has tried NSAIDs for the symptoms. The treatment provided mild relief.   Rare vomiting episodes, but nauseous all day, only vomited 3 times total during this pregnancy. Abdominal pain started 6 weeks ago, when she found out she was  pregnant. Light vaginal bleeding "spotting" at the beginning of pregnancy. Last episode of vaginal bleeding was several weeks ago. No vaginal discharge. No gush of fluids.  No prenatal care yet, planning on going to [facility]. History of Left oophorectomy and cesarean section.   OB History    Gravida Para Term Preterm AB Living   5 1 1   3 1    SAB TAB Ectopic Multiple Live Births   3       1      Past Medical History:  Diagnosis Date  . Anxiety   . Ovarian cyst     Past Surgical History:  Procedure Laterality Date  . CESAREAN SECTION    . LAPAROSCOPIC OVARIAN CYSTECTOMY    . OOPHORECTOMY     left  . OVARIAN CYST REMOVAL     Had to remove L ovary  . UNILATERAL SALPINGECTOMY Right 2012    Family History  Problem Relation Age of Onset  . Heart disease Mother     Social History  Substance Use Topics  . Smoking status: Never Smoker  . Smokeless tobacco: Never Used  . Alcohol use 0.0 oz/week     Comment: occ    Allergies: No Known Allergies  Prescriptions Prior to Admission  Medication Sig Dispense Refill Last Dose  . ALPRAZolam (XANAX) 0.25 MG tablet Take 1 tablet (0.25 mg total) by mouth at bedtime as needed for anxiety. (Patient not taking: Reported on 07/06/2015) 30 tablet 0 Not Taking  . azithromycin (ZITHROMAX Z-PAK) 250 MG tablet Take as directed on pack 6 tablet 0   . ibuprofen (ADVIL,MOTRIN) 800 MG tablet Take 1  tablet (800 mg total) by mouth every 8 (eight) hours as needed. (Patient not taking: Reported on 09/05/2015) 60 tablet 1 Not Taking  . ibuprofen (ADVIL,MOTRIN) 800 MG tablet Take 1 tablet (800 mg total) by mouth every 8 (eight) hours as needed. 30 tablet 5   . ipratropium (ATROVENT) 0.06 % nasal spray Place 2 sprays into both nostrils 4 (four) times daily. 15 mL 1   . tranexamic acid (LYSTEDA) 650 MG TABS tablet Take 2 tablets (1,300 mg total) by mouth 3 (three) times daily. For 5 days. (Patient not taking: Reported on 09/05/2015) 30 tablet 4 Not Taking     Review of Systems  Constitutional: Negative for fever and weight loss.  Gastrointestinal: Positive for abdominal pain, anorexia, nausea and vomiting. Negative for bowel incontinence, constipation, diarrhea and melena.  Genitourinary: Negative for bladder incontinence, dysuria, frequency and hematuria.  Musculoskeletal: Positive for back pain.  Neurological: Positive for weakness. Negative for tingling, numbness, headaches and paresthesias.   Physical Exam   Blood pressure 110/61, pulse 75, temperature 98.5 F (36.9 C), temperature source Oral, resp. rate 18, height 5\' 7"  (1.702 m), weight 88.5 kg (195 lb), last menstrual period 09/08/2016.  Physical Exam  Constitutional: She appears well-developed and well-nourished. No distress.  Eyes: EOM are normal.  Cardiovascular: Normal rate, regular rhythm, S1 normal and S2 normal.  Exam reveals no gallop and no friction rub.   Respiratory: Breath sounds normal.  GI: Soft. She exhibits no distension and no mass. There is no hepatosplenomegaly. There is tenderness in the right lower quadrant. There is guarding. There is no rigidity, no rebound, no tenderness at McBurney's point and negative Murphy's sign.  Genitourinary: Rectum normal and uterus normal. There is no rash, tenderness, lesion or injury on the right labia. There is no rash, tenderness, lesion or injury on the left labia. Cervix exhibits discharge. Cervix exhibits no motion tenderness and no friability. Right adnexum displays tenderness. Right adnexum displays no mass and no fullness. No erythema, tenderness or bleeding in the vagina. No foreign body in the vagina. No signs of injury around the vagina. No vaginal discharge found.  Genitourinary Comments: Cervical discharge was pale yellow in color in small amount.   Neurological: GCS eye subscore is 4. GCS verbal subscore is 5. GCS motor subscore is 6.  Skin: Skin is warm and dry. No rash noted. She is not diaphoretic. No erythema. No  pallor.  Psychiatric: She has a normal mood and affect. Her speech is normal and behavior is normal. Judgment and thought content normal.    MAU Course  Procedures   Results for orders placed or performed during the hospital encounter of 11/05/16 (from the past 24 hour(s))  Urinalysis, Routine w reflex microscopic ([facility])     Status: None   Collection Time: 11/05/16  8:15 AM  Result Value Ref Range   Color, Urine YELLOW YELLOW   APPearance CLEAR CLEAR   Specific Gravity, Urine 1.010 1.005 - 1.030   pH 5.5 5.0 - 8.0   Glucose, UA NEGATIVE NEGATIVE mg/dL   Hgb urine dipstick NEGATIVE NEGATIVE   Bilirubin Urine NEGATIVE NEGATIVE   Ketones, ur NEGATIVE NEGATIVE mg/dL   Protein, ur NEGATIVE NEGATIVE mg/dL   Nitrite NEGATIVE NEGATIVE   Leukocytes, UA NEGATIVE NEGATIVE  Pregnancy, urine POC     Status: Abnormal   Collection Time: 11/05/16  8:22 AM  Result Value Ref Range   Preg Test, Ur POSITIVE (A) NEGATIVE  ABO/Rh     Status:  None (Preliminary result)   Collection Time: 11/05/16  9:46 AM  Result Value Ref Range   ABO/RH(D) A NEG   Wet prep, genital     Status: Abnormal   Collection Time: 11/05/16  9:46 AM  Result Value Ref Range   Yeast Wet Prep HPF POC NONE SEEN NONE SEEN   Trich, Wet Prep NONE SEEN NONE SEEN   Clue Cells Wet Prep HPF POC PRESENT (A) NONE SEEN   WBC, Wet Prep HPF POC FEW (A) NONE SEEN   Sperm NONE SEEN   CBC     Status: Abnormal   Collection Time: 11/05/16  9:47 AM  Result Value Ref Range   WBC 7.6 4.0 - 10.5 K/uL   RBC 3.69 (L) 3.87 - 5.11 MIL/uL   Hemoglobin 8.9 (L) 12.0 - 15.0 g/dL   HCT 16.127.6 (L) 09.636.0 - 04.546.0 %   MCV 74.8 (L) 78.0 - 100.0 fL   MCH 24.1 (L) 26.0 - 34.0 pg   MCHC 32.2 30.0 - 36.0 g/dL   RDW 40.918.7 (H) 81.111.5 - 91.415.5 %   Platelets 248 150 - 400 K/uL  hCG, quantitative, pregnancy     Status: Abnormal   Collection Time: 11/05/16  9:47 AM  Result Value Ref Range   hCG, Beta Chain, Quant, S 83,990 (H) <5 mIU/mL   [place] Ob Comp Less 14  Wks  Result Date: 11/05/2016 CLINICAL DATA:  Vaginal bleeding in pregnancy, first trimester EXAM: OBSTETRIC <14 WK US AND TRANSVAGINAL OB US TECHNIQUE: Both transabdominal and transvaginal ultrasound examinations were performed for complete evaluation of the gestation as well as the maternal uterus, adnexal regions, and pelvic cul-de-sac. Transvaginal technique was performed to assess early pregnancy. COMPARISON:  None. FINDINGS: Intrauterine gestational sac: Visualized, single Yolk sac:  Visualized Embryo:  Visualized Cardiac Activity: Visualized Heart Rate: 173  bpm MSD:   mm    w     d CRL:  26.4  mm   9 w   3 d                  US EDC: 06/07/2017 Subchorionic hemorrhage: Small subchorionic hemorrhage, 2.7 x 2.3 x 1.2 cm Maternal uterus/adnexae: No adnexal masses. Left ovarian corpus luteal cyst. Trace free fluid noted in the left adnexa. IMPRESSION: Nine week 3 day intrauterine pregnancy. Fetal heart rate 173 beats per minute. Small subchorionic hemorrhage. Electronically Signed   By: Charlett NoseKevin  Dover M.D.   On: 11/05/2016 10:42   [place] Ob Transvaginal  Result Date: 11/05/2016 CLINICAL DATA:  Vaginal bleeding in pregnancy, first trimester EXAM: OBSTETRIC <14 WK US AND TRANSVAGINAL OB US TECHNIQUE: Both transabdominal and transvaginal ultrasound examinations were performed for complete evaluation of the gestation as well as the maternal uterus, adnexal regions, and pelvic cul-de-sac. Transvaginal technique was performed to assess early pregnancy. COMPARISON:  None. FINDINGS: Intrauterine gestational sac: Visualized, single Yolk sac:  Visualized Embryo:  Visualized Cardiac Activity: Visualized Heart Rate: 173  bpm MSD:   mm    w     d CRL:  26.4  mm   9 w   3 d                  US EDC: 06/07/2017 Subchorionic hemorrhage: Small subchorionic hemorrhage, 2.7 x 2.3 x 1.2 cm Maternal uterus/adnexae: No adnexal masses. Left ovarian corpus luteal cyst. Trace free fluid noted in the left adnexa. IMPRESSION: Nine week 3  day intrauterine pregnancy. Fetal heart rate 173 beats per minute. Small subchorionic hemorrhage.  Electronically Signed   By: Charlett Nose M.D.   On: 11/05/2016 10:42    MDM Rhogam 300 mcg IM once due to Rh negative status, history of SAB, and plans for TAB.   Assessment and Plan  1. Abdominal Pain in First Trimester  - Anemia likely caused by subchorionic hemorrhage. Prescribed Ferrus Sulfate 325 mg take one by mouth daily. Patient advised to take OTC stool softener up to twice daily if she experiences constipation from the iron supplement.  - Patient advised not to take Ibuprofen in first trimester of pregnancy, and instead take Tylenol.  - Patient advised on reasons to return to MAU.   2. Bacterial Vaginosis  - Prescribed Metronidazole 500mg  by mouth twice daily for 7 days.  - Discharged home.   Cala Bradford Mozel Burdett 11/05/2016, 12:01 PM

## 2016-11-05 NOTE — MAU Note (Signed)
+   HPT in October. About 2 weeks later started having stomach pain. Now is everyday, sometimes shooting pain. C/O nausea. Not vomiting. States had small amount of bleeding in the beginning, but not for a while now. No urinary sxs. Denies constipation or diarrhea.

## 2016-11-06 LAB — RH IG WORKUP (INCLUDES ABO/RH)
ABO/RH(D): A NEG
Antibody Screen: NEGATIVE
Gestational Age(Wks): 9
UNIT DIVISION: 0

## 2016-11-06 LAB — HIV ANTIBODY (ROUTINE TESTING W REFLEX): HIV Screen 4th Generation wRfx: NONREACTIVE

## 2016-11-07 LAB — GC/CHLAMYDIA PROBE AMP (~~LOC~~) NOT AT ARMC
CHLAMYDIA, DNA PROBE: NEGATIVE
Neisseria Gonorrhea: NEGATIVE

## 2016-11-25 ENCOUNTER — Telehealth: Payer: Self-pay

## 2016-11-25 NOTE — Telephone Encounter (Signed)
Patient called in with questions about her FMLA paperwork.

## 2016-12-01 ENCOUNTER — Telehealth: Payer: Self-pay

## 2016-12-01 NOTE — Telephone Encounter (Signed)
Returned call, no answer, left vm 

## 2016-12-02 ENCOUNTER — Telehealth: Payer: Self-pay

## 2016-12-02 NOTE — Telephone Encounter (Signed)
error 

## 2016-12-02 NOTE — Telephone Encounter (Signed)
Returned call and advised patient that there is no documentation of a miscarriage. Hospital notes from 11-22 last stated that she had a viable pregnancy. Patient stated that she knew herself after she let that she was having a miscarriage. Advised pt that she would have to follow up to have that documented, if she wanted a note to be excused for work. Patient stated that she would go to the hospital today to be checked.

## 2016-12-25 ENCOUNTER — Telehealth: Payer: Self-pay | Admitting: Obstetrics

## 2016-12-25 NOTE — Telephone Encounter (Signed)
Telephone call to patient regarding FMLA papers that were faxed to our office.  Patient has not been seen at this office since 09/05/15.  There is a November 2017 note where patient was seen in MAU for vaginal bleeding in early pregnancy.  Left message with patient to call us back regarding the forms.  It is unclear why she is requesting FMLA.  Patient will need to make an appointment with our office for any further complications or needs.

## 2017-03-31 IMAGING — US US OB COMP LESS 14 WK
1 series · 15 of 28 positions shown · non-contrast
Comparison: None.

CLINICAL DATA: Vaginal bleeding in pregnancy, first trimester

EXAM:
OBSTETRIC <14 WK US AND TRANSVAGINAL OB US
TECHNIQUE: Both transabdominal and transvaginal ultrasound examinations were
performed for complete evaluation of the gestation as well as the
maternal uterus, adnexal regions, and pelvic cul-de-sac.
Transvaginal technique was performed to assess early pregnancy.

[Series 1: us ob comp less 14 wk · 72 acquisitions, 15 frames shown]
[im 1/72]
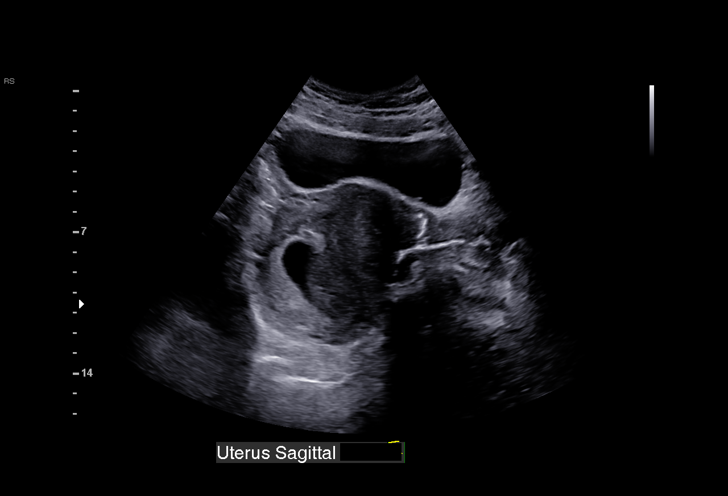
[im 6/72]
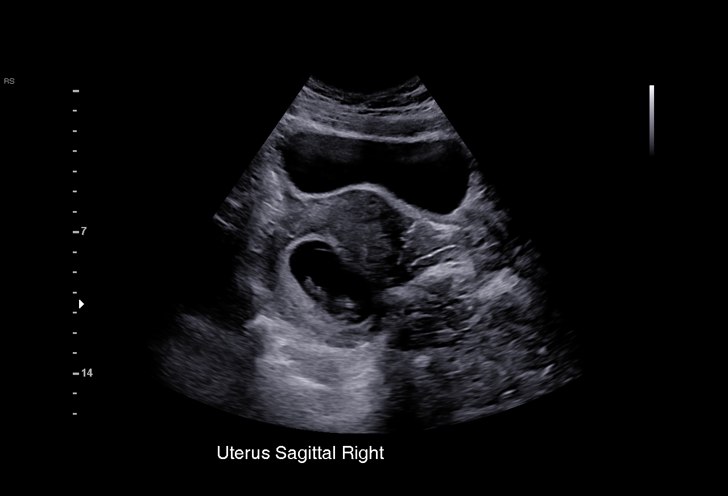
[im 11/72]
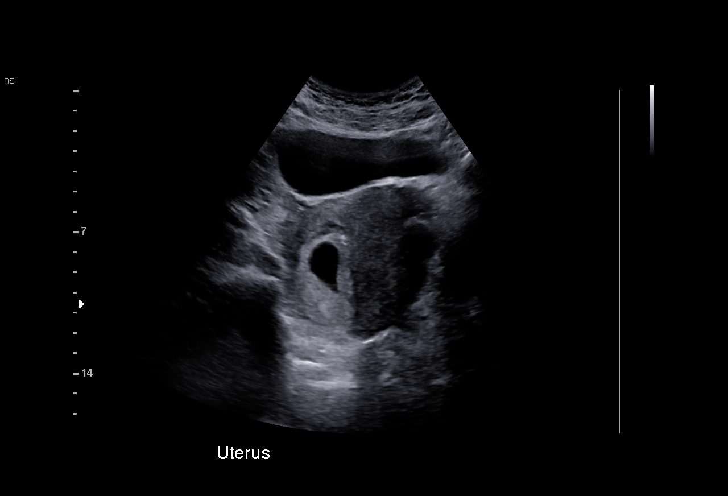
[im 16/72]
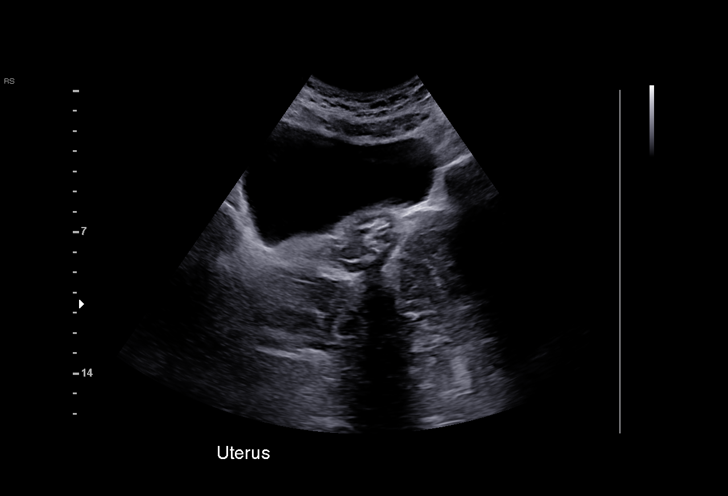
[im 22/72]
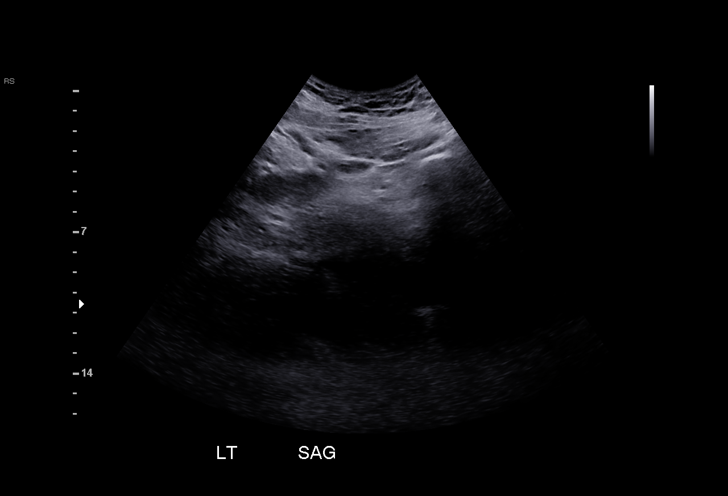
[im 27/72]
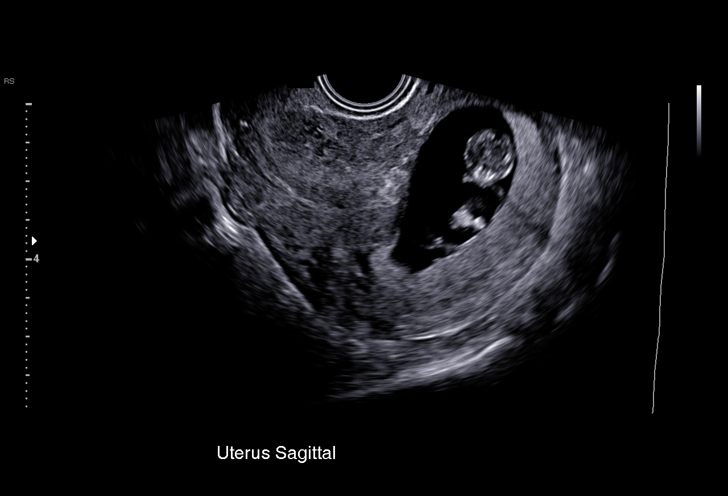
[im 32/72]
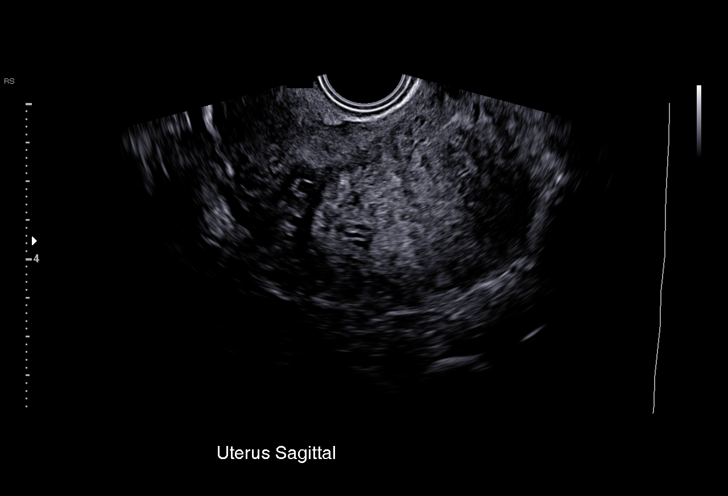
[im 37/72]
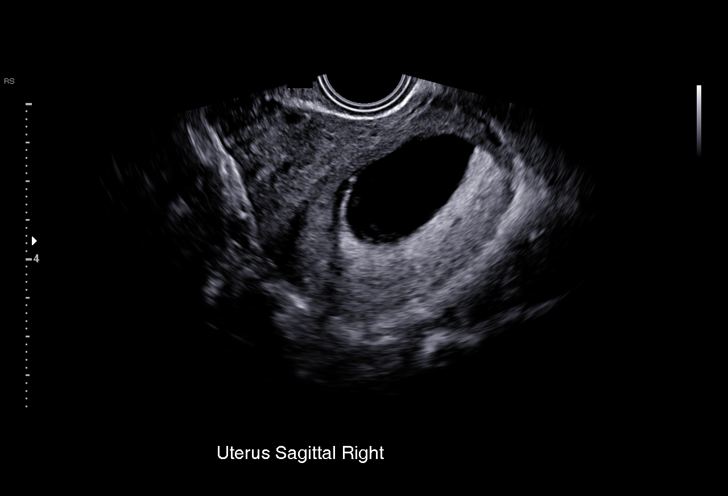
[im 40/72]
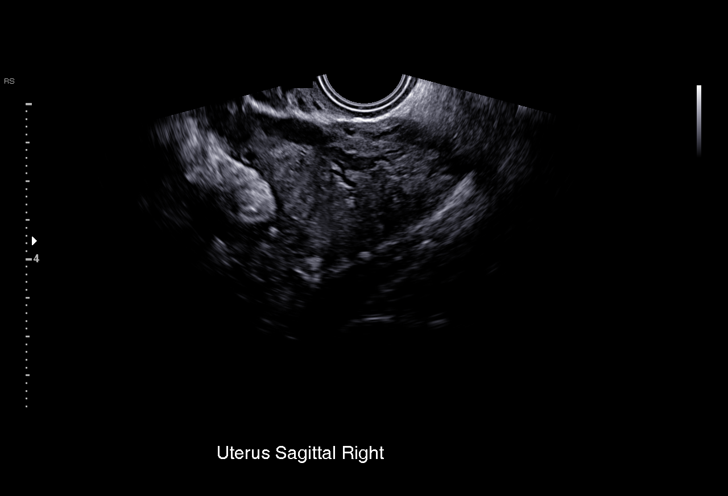
[im 45/72]
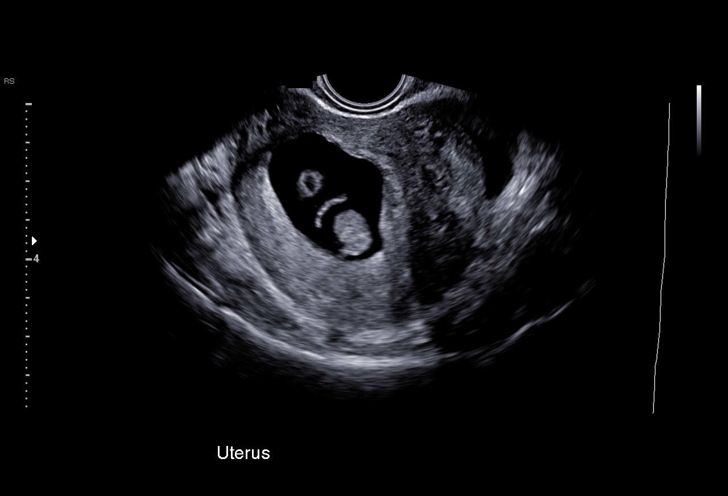
[im 50/72]
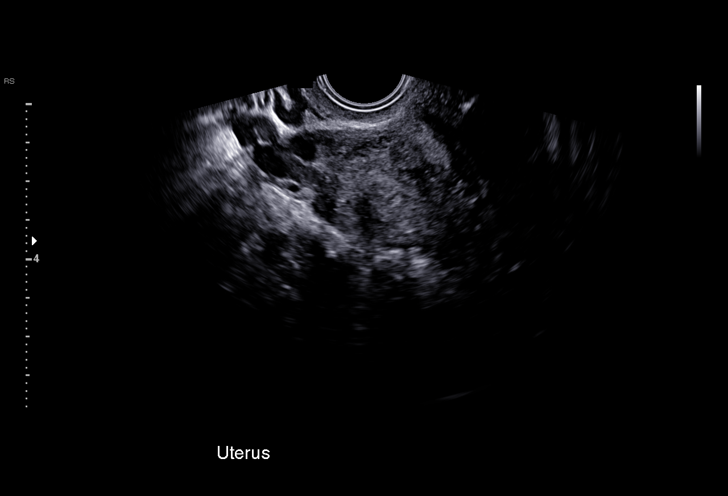
[im 56/72]
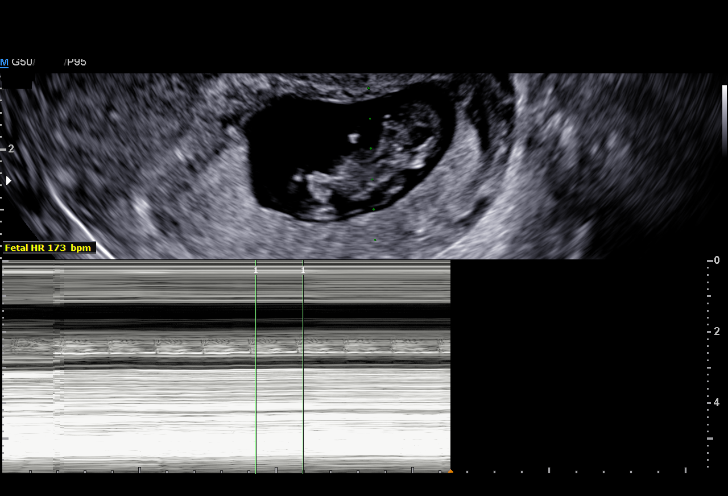
[im 61/72]
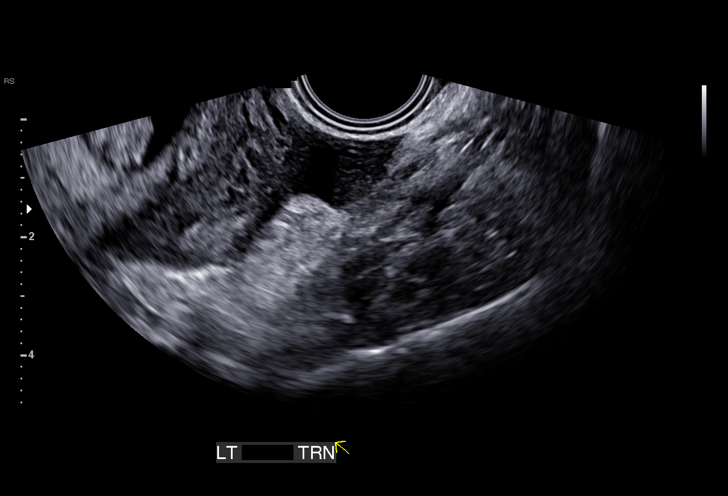
[im 66/72]
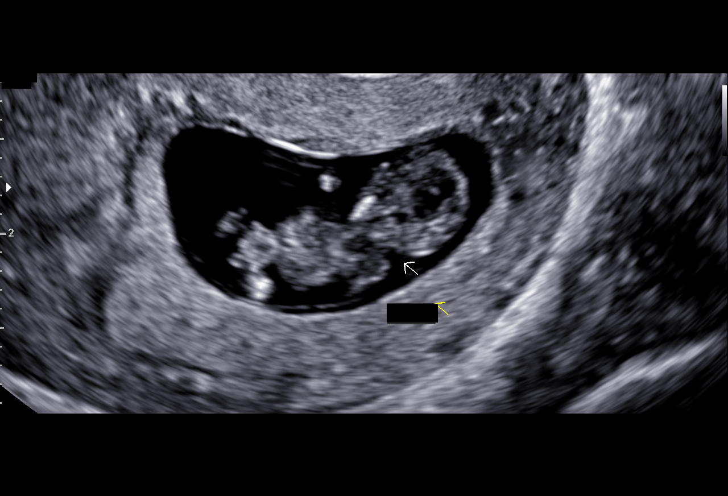
[im 72/72]
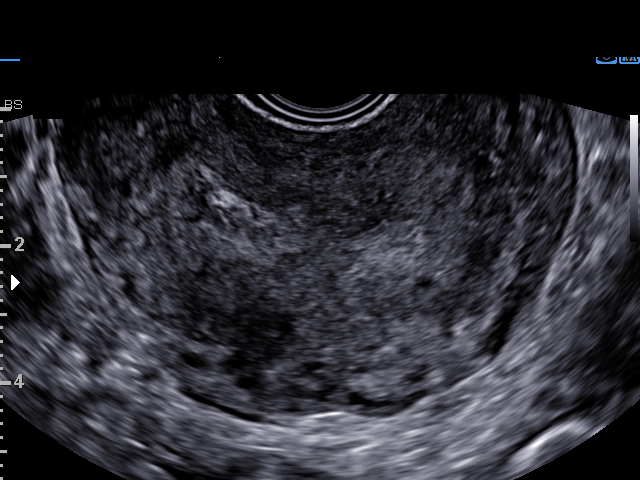

[15 of 28 positions shown; findings below may reference images not displayed]

FINDINGS: Intrauterine gestational sac: Visualized, single

Yolk sac:  Visualized

Embryo:  Visualized

Cardiac Activity: Visualized

Heart Rate: 173  bpm

MSD:   mm    w     d

CRL:  26.4  mm   9 w   3 d                  US EDC: 06/07/2017

Subchorionic hemorrhage: Small subchorionic hemorrhage, 2.7 x 2.3 x
1.2 cm

Maternal uterus/adnexae: No adnexal masses. Left ovarian corpus
luteal cyst. Trace free fluid noted in the left adnexa.
IMPRESSION: Nine week 3 day intrauterine pregnancy. Fetal heart rate 173 beats
per minute. Small subchorionic hemorrhage.

## 2017-07-30 ENCOUNTER — Other Ambulatory Visit: Payer: Self-pay | Admitting: Plastic Surgery

## 2017-09-10 ENCOUNTER — Encounter (HOSPITAL_COMMUNITY): Payer: Self-pay

## 2022-09-02 DIAGNOSIS — Z3202 Encounter for pregnancy test, result negative: Secondary | ICD-10-CM | POA: Diagnosis not present

## 2022-09-02 DIAGNOSIS — M5441 Lumbago with sciatica, right side: Secondary | ICD-10-CM | POA: Diagnosis not present

## 2023-09-01 DIAGNOSIS — Z113 Encounter for screening for infections with a predominantly sexual mode of transmission: Secondary | ICD-10-CM | POA: Diagnosis not present

## 2024-01-09 ENCOUNTER — Ambulatory Visit
Admission: EM | Admit: 2024-01-09 | Discharge: 2024-01-09 | Disposition: A | Discharge status: Home / Self Care | Payer: Medicaid Other | Attending: Family Medicine | Admitting: Family Medicine

## 2024-01-09 ENCOUNTER — Other Ambulatory Visit: Payer: Self-pay

## 2024-01-09 DIAGNOSIS — L723 Sebaceous cyst: Secondary | ICD-10-CM | POA: Diagnosis not present

## 2024-01-09 MED ORDER — SULFAMETHOXAZOLE-TRIMETHOPRIM 800-160 MG PO TABS: 1.0000 | ORAL_TABLET | Freq: Two times a day (BID) | ORAL | 0 refills | Status: AC

## 2024-01-09 NOTE — ED Triage Notes (Signed)
Pt c/o cyst to RT underarm intermittently  x a year+. Says she's had them before but they normally subside on own.

## 2024-01-09 NOTE — ED Provider Notes (Signed)
[facility]    CSN: [id number] Arrival date & time: 01/09/24  1507      History   Chief Complaint Chief Complaint  Patient presents with   Cyst    RT armpit    HPI Tammy Hanson is a 38 y.o. female.   HPI 38 year old female presents with cyst of right axilla on/off for 1 year.  PMH significant for morbid obesity.  Patient is accompanied by her husband this afternoon.  Past Medical History:  Diagnosis Date   Anxiety    Ovarian cyst     There are no active problems to display for this patient.   Past Surgical History:  Procedure Laterality Date   CESAREAN SECTION     LAPAROSCOPIC OVARIAN CYSTECTOMY     OOPHORECTOMY     left   OVARIAN CYST REMOVAL     Had to remove L ovary   UNILATERAL SALPINGECTOMY Right 2012    OB History     Gravida  5   Para  1   Term  1   Preterm      AB  3   Living  1      SAB  3   IAB      Ectopic      Multiple      Live Births  1            Home Medications    Prior to Admission medications   Medication Sig Start Date End Date Taking? Authorizing Provider  sulfamethoxazole-trimethoprim (BACTRIM DS) 800-160 MG tablet Take 1 tablet by mouth 2 (two) times daily for 7 days. 01/09/24 01/16/24 Yes Trevor Iha, FNP  ferrous sulfate 325 (65 FE) MG tablet Take 1 tablet (325 mg total) by mouth daily. 11/05/16   Rasch, Victorino Dike I, NP  metroNIDAZOLE (FLAGYL) 500 MG tablet Take 1 tablet (500 mg total) by mouth 2 (two) times daily. 11/05/16   Rasch, Harolyn Rutherford, NP    Family History Family History  Problem Relation Age of Onset   Heart disease Mother     Social History Social History   Tobacco Use   Smoking status: Never   Smokeless tobacco: Never  Substance Use Topics   Alcohol use: Yes    Alcohol/week: 0.0 standard drinks of alcohol    Comment: occ   Drug use: No     Allergies   Patient has no known allergies.   Review of Systems Review of Systems  Skin:  Positive for rash.  All  other systems reviewed and are negative.    Physical Exam Triage Vital Signs ED Triage Vitals  Encounter Vitals Group     BP      Systolic BP Percentile      Diastolic BP Percentile      Pulse      Resp      Temp      Temp src      SpO2      Weight      Height      Head Circumference      Peak Flow      Pain Score      Pain Loc      Pain Education      Exclude from Growth Chart    No data found.  Updated Vital Signs BP (!) 142/87 (BP Location: Right Arm)   Pulse 70   Temp 98.6 F (37 C) (Oral)   Resp 17   LMP 01/09/2024 (Exact Date)  SpO2 99%   Breastfeeding No    Physical Exam Vitals and nursing note reviewed.  Constitutional:      Appearance: Normal appearance. She is obese.  HENT:     Head: Normocephalic and atraumatic.     Right Ear: Tympanic membrane, ear canal and external ear normal.     Left Ear: Tympanic membrane, ear canal and external ear normal.     Mouth/Throat:     Mouth: Mucous membranes are moist.     Pharynx: Oropharynx is clear.  Eyes:     Extraocular Movements: Extraocular movements intact.     Conjunctiva/sclera: Conjunctivae normal.     Pupils: Pupils are equal, round, and reactive to light.  Cardiovascular:     Rate and Rhythm: Normal rate and regular rhythm.     Pulses: Normal pulses.     Heart sounds: Normal heart sounds.  Pulmonary:     Effort: Pulmonary effort is normal.     Breath sounds: Normal breath sounds. No wheezing, rhonchi or rales.  Musculoskeletal:        General: Normal range of motion.     Cervical back: Normal range of motion and neck supple.  Skin:    General: Skin is warm and dry.     Comments: Right axilla: Tiny erythematous/mucopurulent discharge noted-see image below  Neurological:     General: No focal deficit present.     Mental Status: She is alert and oriented to person, place, and time. Mental status is at baseline.  Psychiatric:        Mood and Affect: Mood normal.        Behavior: Behavior  normal.      UC Treatments / Results  Labs (all labs ordered are listed, but only abnormal results are displayed) Labs Reviewed - No data to display  EKG   Radiology No results found.  Procedures Procedures (including critical care time)  Medications Ordered in UC Medications - No data to display  Initial Impression / Assessment and Plan / UC Course  I have reviewed the triage vital signs and the nursing notes.  Pertinent labs & imaging results that were available during my care of the patient were reviewed by me and considered in my medical decision making (see chart for details).     MDM: 1.  Sebaceous cyst of right axilla-Rx'd Bactrim 800/160 mg tablet: Take 1 tablet twice daily x 7 days. Advised patient take medication as directed with food to completion.  Encouraged to increase daily water intake to 64 ounces per day while taking this medication.  Advised patient to allow area to drain including bloody purulent discharge.  Advised if symptoms worsen and/or unresolved please follow-up with your PCP for further evaluation.  Patient discharged home, hemodynamically stable. Final Clinical Impressions(s) / UC Diagnoses   Final diagnoses:  Sebaceous cyst of right axilla     Discharge Instructions      Advised patient take medication as directed with food to completion.  Encouraged to increase daily water intake to 64 ounces per day while taking this medication.  Advised patient to allow area to drain including bloody purulent discharge.  Advised if symptoms worsen and/or unresolved please follow-up with your PCP for further evaluation.     ED Prescriptions     Medication Sig Dispense Auth. Provider   sulfamethoxazole-trimethoprim (BACTRIM DS) 800-160 MG tablet Take 1 tablet by mouth 2 (two) times daily for 7 days. 14 tablet Trevor Iha, FNP      PDMP not  reviewed this encounter.   Trevor Iha, FNP 01/09/24 1557

## 2024-01-09 NOTE — Discharge Instructions (Addendum)
Advised patient take medication as directed with food to completion.  Encouraged to increase daily water intake to 64 ounces per day while taking this medication.  Advised patient to allow area to drain including bloody purulent discharge.  Advised if symptoms worsen and/or unresolved please follow-up with your PCP for further evaluation.

## 2024-01-13 ENCOUNTER — Encounter: Payer: Self-pay | Admitting: Urgent Care

## 2024-01-13 ENCOUNTER — Ambulatory Visit: Payer: Medicaid Other | Admitting: Urgent Care

## 2024-01-13 VITALS — BP 119/81 | HR 79 | Ht 67.0 in | Wt 217.8 lb

## 2024-01-13 DIAGNOSIS — L732 Hidradenitis suppurativa: Secondary | ICD-10-CM | POA: Diagnosis not present

## 2024-01-13 DIAGNOSIS — L723 Sebaceous cyst: Secondary | ICD-10-CM

## 2024-01-13 MED ORDER — CLINDAMYCIN PHOSPHATE 1 % EX SOLN: Freq: Two times a day (BID) | CUTANEOUS | 6 refills | Status: AC

## 2024-01-13 MED ORDER — MUPIROCIN 2 % EX OINT: 1.0000 | TOPICAL_OINTMENT | Freq: Three times a day (TID) | CUTANEOUS | 0 refills | Status: DC

## 2024-01-13 NOTE — Patient Instructions (Signed)
You have a sebacious cyst. The infection is nearly resolved. Continue the bactrim until gone. Use topical mupirocin ointment over the area three times daily until resolved.  If you would like surgical removal of the cyst, I will place a referral to outpatient general surgery.   To prevent new boils from occurring, please use topical clindamycin twice daily. Use a thin cotton pad and apply the solution.    Consider setting up an annual physical and completing your pap smear.

## 2024-01-13 NOTE — Progress Notes (Signed)
New Patient Office Visit  Subjective:  Patient ID: Tammy Hanson, female    DOB: 02/26/1986  Age: 38 y.o. MRN: [id number]  CC:  Chief Complaint  Patient presents with   Establish Care    New pt est care. She was seen in urgent care for a cyst under her right arm. She states it is better and is currently draining.    HPI Tammy Hanson presents to establish care.  Discussed the use of AI scribe software for clinical note transcription with the patient, who gave verbal consent to proceed.  History of Present Illness   The patient presents with a recurrent armpit infection. She was referred by another provider for evaluation of an armpit infection.  She has been experiencing recurrent infections in the right armpit for over a year, with the current episode being the most severe. The infection is localized to the right armpit, with no similar issues in the left armpit, under the breast, or in the groin.  The current episode began a couple of weeks ago and recently burst open, leading to oozing and drainage of white material. She describes significant pain when touching the area, and the infection has caused difficulty in lifting her arm due to pain. She has been applying warm compresses intermittently and used cortisone cream once, which coincided with the lesion opening the following day.  She visited urgent care where she was prescribed Bactrim, which she has been taking for three days. The medication has started to drain the infection. No fever has been experienced during this episode.  She has a family history of similar issues, as her mother also experiences them.  She resides in [place] and has been using gauze to cover the area to prevent further irritation and drainage on clothing. She does not have a primary care provider currently, as her previous one left the practice.     She denies any additional concerns or complaints today.  Outpatient Encounter  Medications as of 01/13/2024  Medication Sig   clindamycin (CLEOCIN T) 1 % external solution Apply topically 2 (two) times daily.   mupirocin ointment (BACTROBAN) 2 % Apply 1 Application topically 3 (three) times daily.   sulfamethoxazole-trimethoprim (BACTRIM DS) 800-160 MG tablet Take 1 tablet by mouth 2 (two) times daily for 7 days.   [DISCONTINUED] ferrous sulfate 325 (65 FE) MG tablet Take 1 tablet (325 mg total) by mouth daily.   [DISCONTINUED] metroNIDAZOLE (FLAGYL) 500 MG tablet Take 1 tablet (500 mg total) by mouth 2 (two) times daily.   No facility-administered encounter medications on file as of 01/13/2024.    Past Medical History:  Diagnosis Date   Anemia    Anxiety    Depression    Ovarian cyst     Past Surgical History:  Procedure Laterality Date   BREAST SURGERY     CESAREAN SECTION     LAPAROSCOPIC OVARIAN CYSTECTOMY     OOPHORECTOMY     left   OVARIAN CYST REMOVAL     Had to remove L ovary   UNILATERAL SALPINGECTOMY Right 2012    Family History  Problem Relation Age of Onset   Heart disease Mother     Social History   Socioeconomic History   Marital status: Single    Spouse name: Not on file   Number of children: Not on file   Years of education: Not on file   Highest education level: Bachelor's degree (e.g., BA, AB, BS)  Occupational History  Not on file  Tobacco Use   Smoking status: Never   Smokeless tobacco: Never  Substance and Sexual Activity   Alcohol use: Not Currently    Comment: occ   Drug use: No   Sexual activity: Not Currently    Partners: Male    Birth control/protection: Abstinence  Other Topics Concern   Not on file  Social History Narrative   Not on file   Social Drivers of Health   Financial Resource Strain: Medium Risk (01/12/2024)   Overall Financial Resource Strain (CARDIA)    Difficulty of Paying Living Expenses: Somewhat hard  Food Insecurity: No Food Insecurity (01/12/2024)   Hunger Vital Sign    Worried About  Running Out of Food in the Last Year: Never true    Ran Out of Food in the Last Year: Never true  Transportation Needs: No Transportation Needs (01/12/2024)   PRAPARE - Administrator, Civil Service (Medical): No    Lack of Transportation (Non-Medical): No  Physical Activity: Insufficiently Active (01/12/2024)   Exercise Vital Sign    Days of Exercise per Week: 3 days    Minutes of Exercise per Session: 10 min  Stress: Stress Concern Present (01/12/2024)   [employer] of Occupational Health - Occupational Stress Questionnaire    Feeling of Stress : Very much  Social Connections: Moderately Integrated (01/12/2024)   Social Connection and Isolation Panel [NHANES]    Frequency of Communication with Friends and Family: More than three times a week    Frequency of Social Gatherings with Friends and Family: Once a week    Attends Religious Services: Never    Database administrator or Organizations: Yes    Attends Banker Meetings: 1 to 4 times per year    Marital Status: Married  Catering manager Violence: Not At Risk (11/10/2022)   Received from [facility]   HITS    Over the last 12 months how often did your partner physically hurt you?: Never    Over the last 12 months how often did your partner insult you or talk down to you?: Never    Over the last 12 months how often did your partner threaten you with physical harm?: Never    Over the last 12 months how often did your partner scream or curse at you?: Never    ROS: as noted in HPI  Objective:  BP 119/81   Pulse 79   Ht 5\' 7"  (1.702 m)   Wt 217 lb 12.8 oz (98.8 kg)   LMP 01/09/2024 (Exact Date)   SpO2 98%   BMI 34.11 kg/m   Physical Exam Vitals and nursing note reviewed. Exam conducted with a chaperone present.  Constitutional:      General: She is not in acute distress.    Appearance: Normal appearance. She is not ill-appearing, toxic-appearing or diaphoretic.  HENT:     Head: Normocephalic  and atraumatic.  Eyes:     General: No scleral icterus.       Right eye: No discharge.        Left eye: No discharge.  Cardiovascular:     Rate and Rhythm: Normal rate.  Pulmonary:     Effort: Pulmonary effort is normal. No respiratory distress.  Chest:    Musculoskeletal:        General: Normal range of motion.     Cervical back: No rigidity.  Lymphadenopathy:     Cervical: No cervical adenopathy.  Skin:  General: Skin is warm and dry.     Coloration: Skin is not jaundiced.     Findings: Lesion present. No bruising or erythema.  Neurological:     General: No focal deficit present.     Mental Status: She is alert and oriented to person, place, and time.     Last CBC Lab Results  Component Value Date   WBC 7.6 11/05/2016   HGB 8.9 (L) 11/05/2016   HCT 27.6 (L) 11/05/2016   MCV 74.8 (L) 11/05/2016   MCH 24.1 (L) 11/05/2016   RDW 18.7 (H) 11/05/2016   PLT 248 11/05/2016   Last metabolic panel Lab Results  Component Value Date   GLUCOSE 93 07/06/2015   NA 139 07/06/2015   K 4.2 07/06/2015   CL 102 07/06/2015   CO2 24 07/06/2015   BUN 6 07/06/2015   CREATININE 1.00 07/06/2015   CALCIUM 9.5 07/06/2015   PROT 7.7 07/06/2015   ALBUMIN 4.2 07/06/2015   BILITOT 0.8 07/06/2015   ALKPHOS 62 07/06/2015   AST 15 07/06/2015   ALT <8 07/06/2015      Assessment & Plan:  Hidradenitis axillaris -     Clindamycin Phosphate; Apply topically 2 (two) times daily.  Dispense: 30 mL; Refill: 6  Sebaceous cyst of axilla -     Mupirocin; Apply 1 Application topically 3 (three) times daily.  Dispense: 22 g; Refill: 0   Assessment and Plan    Hidradenitis Suppurativa Recurrent painful cyst in the right axilla, currently draining. No other skin issues reported. Family history of similar condition. Currently on Bactrim. -Continue Bactrim until completion. -Start topical Clindamycin solution twice daily for prevention of future flares. -Start Mupirocin ointment three times  daily until the current lesion is closed. -Consider surgical excision if recurrent or persistent issues. -Check in 2 weeks to assess response to treatment.  General Health Maintenance -Consider annual physical including Pap smear and blood work.       No follow-ups on file.   Maretta Bees, PA

## 2024-01-18 ENCOUNTER — Encounter: Payer: Self-pay | Admitting: Urgent Care

## 2024-01-18 DIAGNOSIS — H5213 Myopia, bilateral: Secondary | ICD-10-CM | POA: Diagnosis not present

## 2024-02-20 ENCOUNTER — Encounter: Payer: Self-pay | Admitting: Urgent Care

## 2024-02-22 NOTE — Telephone Encounter (Signed)
 Please call patient and have her schedule a follow up. Mychart is not a replacement for an office visit. Please also encourage pt to be using the topical clindamycin daily.

## 2024-02-22 NOTE — Telephone Encounter (Signed)
Pt scheduled for 3/11

## 2024-02-23 ENCOUNTER — Encounter: Payer: Self-pay | Admitting: Urgent Care

## 2024-02-23 ENCOUNTER — Ambulatory Visit: Payer: Self-pay | Admitting: Urgent Care

## 2024-02-23 VITALS — BP 106/72 | HR 71 | Wt 228.8 lb

## 2024-02-23 DIAGNOSIS — L732 Hidradenitis suppurativa: Secondary | ICD-10-CM

## 2024-02-23 MED ORDER — SPIRONOLACTONE 25 MG PO TABS: 25.0000 mg | ORAL_TABLET | Freq: Every day | ORAL | 0 refills | Status: DC

## 2024-02-23 MED ORDER — DOXYCYCLINE HYCLATE 50 MG PO CAPS: 50.0000 mg | ORAL_CAPSULE | Freq: Every day | ORAL | 1 refills | Status: DC

## 2024-02-23 NOTE — Patient Instructions (Addendum)
 Please call [facility] to discuss possible excision [address], [place], [place] [address] [phone]   Start taking one tablet of doxycycline daily, at least one hour before or two hours after meals. Please also start one tablet of spironolactone daily, this will help prevent new lesions. If you feel lightheaded upon standing, or if your blood pressure drops, please stop the spironolactone.  Continue the topical clindamycin wash. We will contact you with the results of the wound culture only if a change in treatment is indicated.

## 2024-02-23 NOTE — Progress Notes (Unsigned)
 Established Patient Office Visit  Subjective:  Patient ID: Tammy Hanson, female    DOB: 1986/05/15  Age: 38 y.o. MRN: [id number]  Chief Complaint  Patient presents with   Follow-up    Follow up on cyst. She states it got better be then it returned. She is also having ringing and tingling in her right ear.    HPI  Pleasant 38yo female presents with a recurrent bump on her right arm that is actively draining.  She first noticed the bump (for this current episode) on February 16, 2024, after waxing for the first time, having previously used a razor, which she believes contributed to the issue. Two days post-waxing, the bump reappeared, worsened, and began actively draining blood and white material. The area around the bump appears bruised, and she has documented the changes with photos and videos. The bump changes rapidly, with noticeable differences daily.  She has been using topical mupirocin, and a clindamycin wash, which initially helped the bump to close and reduce in size. However, the bump has since reopened and is actively draining again. She has refilled her prescription for the topical ointment and continues to use it along with the wash. No similar issues are present on the other side of her body, and she has no known allergies to antibiotics. She has not experienced any fever or other systemic symptoms.  Additionally, she reports a sensation of tickling and occasional fogginess in her right ear, which she describes as feeling like there might be a bug inside.       There are no active problems to display for this patient.  Past Medical History:  Diagnosis Date   Anemia    Anxiety    Depression    Ovarian cyst    Past Surgical History:  Procedure Laterality Date   BREAST SURGERY     CESAREAN SECTION     LAPAROSCOPIC OVARIAN CYSTECTOMY     OOPHORECTOMY     left   OVARIAN CYST REMOVAL     Had to remove L ovary   UNILATERAL SALPINGECTOMY Right 2012   Social  History   Tobacco Use   Smoking status: Never   Smokeless tobacco: Never  Substance Use Topics   Alcohol use: Not Currently    Comment: occ   Drug use: No      ROS: as noted in HPI  Objective:     BP 106/72   Pulse 71   Wt 228 lb 12.8 oz (103.8 kg)   SpO2 97%   BMI 35.84 kg/m  BP Readings from Last 3 Encounters:  02/23/24 106/72  01/13/24 119/81  01/09/24 (!) 142/87   Wt Readings from Last 3 Encounters:  02/23/24 228 lb 12.8 oz (103.8 kg)  01/13/24 217 lb 12.8 oz (98.8 kg)  11/05/16 195 lb (88.5 kg)      Physical Exam Vitals and nursing note reviewed. Exam conducted with a chaperone present.  Constitutional:      General: She is not in acute distress.    Appearance: Normal appearance. She is obese. She is not ill-appearing, toxic-appearing or diaphoretic.  HENT:     Head: Normocephalic and atraumatic.     Right Ear: Tympanic membrane, ear canal and external ear normal. There is no impacted cerumen.     Left Ear: Tympanic membrane, ear canal and external ear normal. There is no impacted cerumen.  Eyes:     General: No scleral icterus.       Right eye: No  discharge.        Left eye: No discharge.  Cardiovascular:     Rate and Rhythm: Normal rate.  Pulmonary:     Effort: Pulmonary effort is normal. No respiratory distress.  Chest:    Musculoskeletal:        General: Normal range of motion.     Cervical back: No rigidity.  Lymphadenopathy:     Cervical: No cervical adenopathy.  Skin:    General: Skin is warm and dry.     Coloration: Skin is not jaundiced.     Findings: Lesion present. No bruising or erythema.  Neurological:     General: No focal deficit present.     Mental Status: She is alert and oriented to person, place, and time.      No results found for any visits on 02/23/24.  Last CBC Lab Results  Component Value Date   WBC 7.6 11/05/2016   HGB 8.9 (L) 11/05/2016   HCT 27.6 (L) 11/05/2016   MCV 74.8 (L) 11/05/2016   MCH 24.1 (L)  11/05/2016   RDW 18.7 (H) 11/05/2016   PLT 248 11/05/2016   Last metabolic panel Lab Results  Component Value Date   GLUCOSE 93 07/06/2015   NA 139 07/06/2015   K 4.2 07/06/2015   CL 102 07/06/2015   CO2 24 07/06/2015   BUN 6 07/06/2015   CREATININE 1.00 07/06/2015   CALCIUM 9.5 07/06/2015   PROT 7.7 07/06/2015   ALBUMIN 4.2 07/06/2015   BILITOT 0.8 07/06/2015   ALKPHOS 62 07/06/2015   AST 15 07/06/2015   ALT <8 07/06/2015      The ASCVD Risk score (Arnett DK, et al., 2019) failed to calculate for the following reasons:   The 2019 ASCVD risk score is only valid for ages 62 to 48  Assessment & Plan:  Hidradenitis axillaris -     Doxycycline Hyclate; Take 1 capsule (50 mg total) by mouth daily.  Dispense: 30 capsule; Refill: 1 -     Spironolactone; Take 1 tablet (25 mg total) by mouth daily.  Dispense: 90 tablet; Refill: 0 -     Ambulatory referral to General Surgery -     WOUND CULTURE  Assessment and Plan    Hidradenitis suppurativa Recurrent lesion on right arm, Hurley stage II, with drainage, induration, possible sinus tract. Previous Bactrim insufficient. Potential MRSA; wound culture taken. Doxycycline preferred. Spironolactone for prevention. Possible surgical intervention needed. - Prescribe doxycycline 50mg  daily for one month. - Prescribe spironolactone to prevent recurrence. - Refer to general surgery for potential outpatient surgical intervention. - Advise to avoid sun exposure while on doxycycline due to photosensitivity risk. - Instruct on proper timing of doxycycline administration relative to meals, avoiding dairy. - Advise to monitor for symptoms of hypotension with spironolactone and discontinue if lightheadedness occurs. - Contact with wound culture results only if antibiotic change is necessary.  Ear discomfort Intermittent tickling sensation and foggy sound in right ear. No foreign body. - Reassured no foreign body in ear.         No follow-ups  on file.   Maretta Bees, PA

## 2024-02-27 LAB — WOUND CULTURE
MICRO NUMBER:: 16186521
RESULT:: NO GROWTH
SPECIMEN QUALITY:: ADEQUATE

## 2024-02-29 ENCOUNTER — Encounter: Payer: Self-pay | Admitting: Urgent Care

## 2024-03-18 DIAGNOSIS — L732 Hidradenitis suppurativa: Secondary | ICD-10-CM | POA: Diagnosis not present

## 2024-05-18 ENCOUNTER — Other Ambulatory Visit: Payer: Self-pay

## 2024-05-18 DIAGNOSIS — L732 Hidradenitis suppurativa: Secondary | ICD-10-CM

## 2024-05-18 MED ORDER — SPIRONOLACTONE 25 MG PO TABS: 25.0000 mg | ORAL_TABLET | Freq: Every day | ORAL | 1 refills | Status: DC

## 2024-08-04 DIAGNOSIS — E559 Vitamin D deficiency, unspecified: Secondary | ICD-10-CM | POA: Diagnosis not present

## 2024-08-04 DIAGNOSIS — L732 Hidradenitis suppurativa: Secondary | ICD-10-CM | POA: Insufficient documentation

## 2024-08-04 DIAGNOSIS — E66811 Obesity, class 1: Secondary | ICD-10-CM | POA: Diagnosis not present

## 2024-08-04 DIAGNOSIS — R739 Hyperglycemia, unspecified: Secondary | ICD-10-CM | POA: Diagnosis not present

## 2024-08-04 DIAGNOSIS — E538 Deficiency of other specified B group vitamins: Secondary | ICD-10-CM | POA: Diagnosis not present

## 2024-08-04 DIAGNOSIS — Z6830 Body mass index (BMI) 30.0-30.9, adult: Secondary | ICD-10-CM | POA: Diagnosis not present

## 2024-08-04 DIAGNOSIS — L659 Nonscarring hair loss, unspecified: Secondary | ICD-10-CM | POA: Diagnosis not present

## 2024-11-07 DIAGNOSIS — Z0000 Encounter for general adult medical examination without abnormal findings: Secondary | ICD-10-CM | POA: Diagnosis not present

## 2024-11-21 ENCOUNTER — Encounter: Payer: Self-pay | Admitting: Dermatology

## 2024-11-21 ENCOUNTER — Ambulatory Visit: Admitting: Dermatology

## 2024-11-21 VITALS — BP 98/63 | HR 68

## 2024-11-21 DIAGNOSIS — L732 Hidradenitis suppurativa: Secondary | ICD-10-CM | POA: Diagnosis not present

## 2024-11-21 MED ORDER — DOXYCYCLINE HYCLATE 100 MG PO TABS: 100.0000 mg | ORAL_TABLET | Freq: Two times a day (BID) | ORAL | 9 refills | Status: AC

## 2024-11-21 MED ORDER — SPIRONOLACTONE 100 MG PO TABS: 100.0000 mg | ORAL_TABLET | Freq: Every day | ORAL | 9 refills | Status: AC

## 2024-11-21 MED ORDER — MUPIROCIN 2 % EX OINT: 1.0000 | TOPICAL_OINTMENT | Freq: Three times a day (TID) | CUTANEOUS | 9 refills | Status: AC

## 2024-11-21 NOTE — Patient Instructions (Addendum)
 VISIT SUMMARY:  You came in today to manage your hidradenitis suppurativa, a chronic skin condition that causes painful lumps under the skin, primarily in your right armpit. You have not had any severe flares since August 2025, and your current regimen includes topical treatments and an antibacterial wash.  YOUR PLAN:  -HIDRADENITIS SUPPURATIVA:  Hidradenitis suppurativa is a chronic skin condition that causes painful lumps under the skin, often in areas where skin rubs together.   Your condition is currently well-controlled with no severe flares since August 2025. We discussed the hormonal influence and dietary triggers, particularly sugar and dairy, as potential factors that can worsen your condition.   You will restart spironolactone  at 100 mg daily with dinner and use a benzoyl peroxide wash daily on affected areas.   For flare-ups, you will take doxycycline  100 mg twice daily for 7-10 days and continue using mupirocin  topically.   We also advised you to reduce your sugar and dairy intake. Surgery is not curative and is considered only after medical management fails. If your current regimen becomes ineffective, we may consider Cosentyx and possibly laser hair removal.  INSTRUCTIONS:  Please follow up in 6 months to assess the efficacy of your treatment and the progression of your condition.   Important Information   Due to recent changes in healthcare laws, you may see results of your pathology and/or laboratory studies on MyChart before the doctors have had a chance to review them. We understand that in some cases there may be results that are confusing or concerning to you. Please understand that not all results are received at the same time and often the doctors may need to interpret multiple results in order to provide you with the best plan of care or course of treatment. Therefore, we ask that you please give us  2 business days to thoroughly review all your results before contacting  the office for clarification. Should we see a critical lab result, you will be contacted sooner.     If You Need Anything After Your Visit   If you have any questions or concerns for your doctor, please call our main line at [phone]. If no one answers, please leave a voicemail as directed and we will return your call as soon as possible. Messages left after 4 pm will be answered the following business day.    You may also send us  a message via MyChart. We typically respond to MyChart messages within 1-2 business days.  For prescription refills, please ask your pharmacy to contact our office. Our fax number is [phone].  If you have an urgent issue when the clinic is closed that cannot wait until the next business day, you can page your doctor at the number below.     Please note that while we do our best to be available for urgent issues outside of office hours, we are not available 24/7.    If you have an urgent issue and are unable to reach us , you may choose to seek medical care at your doctor's office, retail clinic, urgent care center, or emergency room.   If you have a medical emergency, please immediately call 911 or go to the emergency department. In the event of inclement weather, please call our main line at [phone] for an update on the status of any delays or closures.  Dermatology Medication Tips: Please keep the boxes that topical medications come in in order to help keep track of the instructions about where and how to  use these. Pharmacies typically print the medication instructions only on the boxes and not directly on the medication tubes.   If your medication is too expensive, please contact our office at [phone] or send us  a message through MyChart.    We are unable to tell what your co-pay for medications will be in advance as this is different depending on your insurance coverage. However, we may be able to find a substitute medication at lower cost or  fill out paperwork to get insurance to cover a needed medication.    If a prior authorization is required to get your medication covered by your insurance company, please allow us  1-2 business days to complete this process.   Drug prices often vary depending on where the prescription is filled and some pharmacies may offer cheaper prices.   The website www.goodrx.com contains coupons for medications through different pharmacies. The prices here do not account for what the cost may be with help from insurance (it may be cheaper with your insurance), but the website can give you the price if you did not use any insurance.  - You can print the associated coupon and take it with your prescription to the pharmacy.  - You may also stop by our office during regular business hours and pick up a GoodRx coupon card.  - If you need your prescription sent electronically to a different pharmacy, notify our office through [facility] or by phone at [phone]

## 2024-11-21 NOTE — Progress Notes (Signed)
 New Patient Visit   Subjective  Tammy Hanson is a 38 y.o. female who presents for the following: HS  Patient states she has HS located at the B/L Axilla but mainly flares at Right Side that she would like to have examined. Patient reports the areas have been there for several years. She reports the areas can be bothersome. Patient reports her first severe flare happened in March wear it drained and painful. Patient rates irritation 7 out of 10. Patient reports she has previously been treated for these areas by pcp. She was previously prescribed Spironolactone  25 mg every day, Doxycyline 50 mg capsule, Clindamycin  1% lotion, Mupirocin  Ointment 2%. She reports she will still apply Mupirocin  if the areas become sore. She is no longer taking any of the oral medications. She feels she doe flare when she eats heavy sugar. Patient reports family history of HS (Mom, Brother and Sister).  Patient reports she is not actively pregnant, trying to conceive or nursing.  Patient provided verbal consent for the use of an AI-assisted program to generate a detailed after-visit summary. The patient understands that the AI tool is used to support clinical documentation and that all information will be reviewed and verified by the healthcare provider.  The following portions of the chart were reviewed this encounter and updated as appropriate: medications, allergies, medical history  Review of Systems:  No other skin or systemic complaints except as noted in HPI or Assessment and Plan.  Objective  Well appearing patient in no apparent distress; mood and affect are within normal limits.  A focused examination was performed of the following areas: B/L Axilla  Relevant exam findings are noted in the Assessment and Plan.  Assessment & Plan   HIDRADENITIS SUPPURATIVA Exam: Hurley Stage 1: abscess formation (single or multiple) without sinus tracts or scarring  Not at goal  Patient Education Discussed  During Visit: Hidradenitis Suppurativa is a chronic; persistent; non-curable, but treatable condition due to abnormal inflamed sweat glands in the body folds (axilla, inframammary, groin, medial thighs), causing recurrent painful draining cysts and scarring. It can be associated with severe scarring acne and cysts; also abscesses and scarring of scalp. The goal is control and prevention of flares, as it is not curable. Scars are permanent and can be thickened. Treatment may include daily use of topical medication and oral antibiotics.  Oral isotretinoin may also be helpful.  For some cases, Humira or Cosentyx (biologic injections) may be prescribed to decrease the inflammatory process and prevent flares.  When indicated, inflamed cysts may also be treated surgically.  Chronic hidradenitis suppurativa primarily affecting the right axilla, currently well-controlled with no flares since August. Previous severe flare in March resolved with mupirocin  and clindamycin . Spironolactone  25 mg was ineffective. No systemic treatment history. Current regimen includes topical mupirocin  and clindamycin  during flares, and antibacterial soap for prevention. Discussed hormonal influence and dietary triggers, particularly sugar and dairy, as potential exacerbating factors. Surgery is not curative and is typically considered only after medical management fails. Discussed potential future use of Cosentyx if current regimen becomes ineffective. Laser hair removal may be considered if progressing to Cosentyx.  Treatment Plan: - Recommended washing with BPO (CeraVE or Panoxyl) - Educated of potential causes to dietary aspects - Refills sent for Spironolactone  100 mg, Doxycycline  100 mg and Mupirocin  2% ointment - Educated to take Spironolactone  100 mg nightly and Doxy 2 times dialy for 7 days at the first sign of flare - Educated long term treatments can  consist of Laser Treatments (ND YAG) - If topical and oral are ineffective,  Plan to start Cosentyx - Plan to follow up in 6 months to re-assess  Spironolactone  can cause increased urination and cause blood pressure to decrease. Please watch for signs of lightheadedness and be cautious when changing position. It can sometimes cause breast tenderness or an irregular period in premenopausal women. It can also increase potassium. The increase in potassium usually is not a concern unless you are taking other medicines that also increase potassium, so please be sure your doctor knows all of the other medications you are taking. This medication should not be taken by pregnant women.  This medicine should also not be taken together with sulfa  drugs like Bactrim  (trimethoprim /sulfamethexazole).      HIDRADENITIS SUPPURATIVA   Related Medications spironolactone  (ALDACTONE ) 100 MG tablet Take 1 tablet (100 mg total) by mouth daily. doxycycline  (VIBRA -TABS) 100 MG tablet Take 1 tablet (100 mg total) by mouth 2 (two) times daily. TAKE AT THE FIRST SIGN OF FLARES FOR 1 WEEKS mupirocin  ointment (BACTROBAN ) 2 % Apply 1 Application topically 3 (three) times daily.  Return in about 6 months (around 05/22/2025) for HS F/U.  I, Jetta Ager, am acting as neurosurgeon for [employer], DO.  Documentation: I have reviewed the above documentation for accuracy and completeness, and I agree with the above.  Delon Lenis, DO

## 2024-11-25 DIAGNOSIS — S0990XA Unspecified injury of head, initial encounter: Secondary | ICD-10-CM | POA: Diagnosis not present

## 2024-11-25 DIAGNOSIS — M25551 Pain in right hip: Secondary | ICD-10-CM | POA: Diagnosis not present

## 2024-11-25 DIAGNOSIS — M542 Cervicalgia: Secondary | ICD-10-CM | POA: Diagnosis not present

## 2024-11-25 DIAGNOSIS — S161XXA Strain of muscle, fascia and tendon at neck level, initial encounter: Secondary | ICD-10-CM | POA: Diagnosis not present

## 2024-11-25 DIAGNOSIS — M62838 Other muscle spasm: Secondary | ICD-10-CM | POA: Diagnosis not present

## 2024-12-12 ENCOUNTER — Ambulatory Visit
Admission: EM | Admit: 2024-12-12 | Discharge: 2024-12-12 | Disposition: A | Discharge status: Home / Self Care | Attending: Family Medicine | Admitting: Family Medicine

## 2024-12-12 DIAGNOSIS — S39012A Strain of muscle, fascia and tendon of lower back, initial encounter: Secondary | ICD-10-CM | POA: Diagnosis not present

## 2024-12-12 DIAGNOSIS — S161XXA Strain of muscle, fascia and tendon at neck level, initial encounter: Secondary | ICD-10-CM

## 2024-12-12 DIAGNOSIS — M25551 Pain in right hip: Secondary | ICD-10-CM

## 2024-12-12 DIAGNOSIS — M62830 Muscle spasm of back: Secondary | ICD-10-CM

## 2024-12-12 MED ORDER — METHOCARBAMOL 500 MG PO TABS: 500.0000 mg | ORAL_TABLET | Freq: Two times a day (BID) | ORAL | 0 refills | Status: AC

## 2024-12-12 MED ORDER — PREDNISONE 10 MG (21) PO TBPK: ORAL_TABLET | Freq: Every day | ORAL | 0 refills | Status: AC

## 2024-12-12 MED ORDER — CELECOXIB 200 MG PO CAPS: 200.0000 mg | ORAL_CAPSULE | Freq: Every day | ORAL | 0 refills | Status: AC

## 2024-12-12 NOTE — Discharge Instructions (Addendum)
 Advised patient take medications as directed with food to completion.  Advised to use Robaxin daily or as needed for accompanying neck or back spasms.  Encouraged to increase daily water intake to 64 ounces per day while taking these medications.  Advised if symptoms worsen and/or unresolved please follow-up with your PCP or here for further evaluation.
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
